# Patient Record
Sex: Female | Born: 1995 | Race: Black or African American | Hispanic: No | Marital: Single | Smoking: Never smoker
Health system: Southern US, Community
[De-identification: ages and names within clinical notes are randomized; demographics above are authoritative.]

## PROBLEM LIST (undated history)

## (undated) DIAGNOSIS — R55 Syncope and collapse: Secondary | ICD-10-CM

## (undated) DIAGNOSIS — J45909 Unspecified asthma, uncomplicated: Secondary | ICD-10-CM

---
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
## 2013-09-25 ENCOUNTER — Emergency Department (HOSPITAL_COMMUNITY)
Admission: EM | Admit: 2013-09-25 | Discharge: 2013-09-25 | Disposition: A | Discharge status: Home / Self Care | Payer: Medicaid Other | Source: Home / Self Care

## 2013-09-25 ENCOUNTER — Encounter (HOSPITAL_COMMUNITY): Payer: Self-pay | Admitting: Emergency Medicine

## 2013-09-25 DIAGNOSIS — K529 Noninfective gastroenteritis and colitis, unspecified: Secondary | ICD-10-CM

## 2013-09-25 HISTORY — DX: Unspecified asthma, uncomplicated: J45.909

## 2013-09-25 MED ORDER — ONDANSETRON 4 MG PO TBDP
ORAL_TABLET | ORAL | Status: AC
  Filled 2013-09-25: qty 2

## 2013-09-25 MED ORDER — ONDANSETRON HCL 4 MG PO TABS: 4.0000 mg | ORAL_TABLET | Freq: Four times a day (QID) | ORAL | Status: DC

## 2013-09-25 MED ORDER — ONDANSETRON 4 MG PO TBDP
8.0000 mg | ORAL_TABLET | Freq: Once | ORAL | Status: AC
  Administered 2013-09-25: 8 mg via ORAL

## 2013-09-25 NOTE — Discharge Instructions (Signed)
Clear liquid , bland diet tonight as tolerated, advance on wed as improved, use medicine as needed, imodium for diarrhea, return or see your doctor if any problems. °

## 2013-09-25 NOTE — ED Notes (Signed)
C/o RUQ abdominal pain onset yesterday. States the pain is making her vomit.  Vomited once yesterday and 5 times today.  Diarrhea x 5 today.  No fever.

## 2013-09-25 NOTE — ED Provider Notes (Signed)
CSN: [id number]     Arrival date & time 09/25/13  1923 History   None    Chief Complaint  Patient presents with  . Abdominal Pain   (Consider location/radiation/quality/duration/timing/severity/associated sxs/prior Treatment) Patient is a 18 y.o. female presenting with abdominal pain. The history is provided by the patient.  Abdominal Pain Pain location:  Epigastric Pain quality: burning   Pain severity:  Mild Onset quality:  Sudden Duration:  1 day Timing:  Constant Progression:  Unchanged Chronicity:  New Associated symptoms: chills, diarrhea, fever, nausea and vomiting   Associated symptoms: no melena, no vaginal bleeding and no vaginal discharge   Risk factors comment:  Sick contacts, works at [employer]   Past Medical History  Diagnosis Date  . Asthma    Past Surgical History  Procedure Laterality Date  . Wisdom tooth extraction  2013   Family History  Problem Relation Age of Onset  . Asthma Brother    History  Substance Use Topics  . Smoking status: Never Smoker   . Smokeless tobacco: Not on file  . Alcohol Use: No   OB History   Grav Para Term Preterm Abortions TAB SAB Ect Mult Living                 Review of Systems  Constitutional: Positive for fever and chills.  Gastrointestinal: Positive for nausea, vomiting, abdominal pain and diarrhea. Negative for melena.  Genitourinary: Negative for vaginal bleeding and vaginal discharge.    Allergies  Review of patient's allergies indicates no known allergies.  Home Medications   Current Outpatient Rx  Name  Route  Sig  Dispense  Refill  . albuterol (PROVENTIL HFA;VENTOLIN HFA) 108 (90 BASE) MCG/ACT inhaler   Inhalation   Inhale 2 puffs into the lungs every 6 (six) hours as needed for wheezing or shortness of breath.         . ondansetron (ZOFRAN) 4 MG tablet   Oral   Take 1 tablet (4 mg total) by mouth every 6 (six) hours. Prn n/v   8 tablet   0    BP 123/80  Pulse 79  Temp(Src) 98.3 F  (36.8 C) (Oral)  Resp 16  SpO2 99%  LMP 09/20/2013 Physical Exam  Nursing note and vitals reviewed. Constitutional: She is oriented to person, place, and time. She appears well-developed and well-nourished.  HENT:  Mouth/Throat: Oropharynx is clear and moist.  Neck: Normal range of motion. Neck supple.  Pulmonary/Chest: Breath sounds normal.  Abdominal: Soft. Normal appearance. She exhibits no distension and no mass. Bowel sounds are increased. There is no tenderness. There is no rigidity, no rebound, no guarding, no CVA tenderness, no tenderness at McBurney's point and negative Murphy's sign.  Lymphadenopathy:    She has no cervical adenopathy.  Neurological: She is alert and oriented to person, place, and time.  Skin: Skin is warm and dry.    ED Course  Procedures (including critical care time) Labs Review Labs Reviewed - No data to display Imaging Review No results found.  EKG Interpretation    Date/Time:    Ventricular Rate:    PR Interval:    QRS Duration:   QT Interval:    QTC Calculation:   R Axis:     Text Interpretation:              MDM     Linna HoffJames D Amandajo Gonder, MD 09/25/13 2045

## 2014-02-25 ENCOUNTER — Encounter (HOSPITAL_COMMUNITY): Payer: Self-pay | Admitting: Emergency Medicine

## 2014-02-25 ENCOUNTER — Emergency Department (HOSPITAL_COMMUNITY)
Admission: EM | Admit: 2014-02-25 | Discharge: 2014-02-25 | Disposition: A | Discharge status: Home / Self Care | Payer: Medicaid Other | Attending: Emergency Medicine | Admitting: Emergency Medicine

## 2014-02-25 DIAGNOSIS — R112 Nausea with vomiting, unspecified: Secondary | ICD-10-CM | POA: Insufficient documentation

## 2014-02-25 DIAGNOSIS — N390 Urinary tract infection, site not specified: Secondary | ICD-10-CM | POA: Insufficient documentation

## 2014-02-25 DIAGNOSIS — J45909 Unspecified asthma, uncomplicated: Secondary | ICD-10-CM | POA: Insufficient documentation

## 2014-02-25 DIAGNOSIS — Z79899 Other long term (current) drug therapy: Secondary | ICD-10-CM | POA: Insufficient documentation

## 2014-02-25 DIAGNOSIS — Z3202 Encounter for pregnancy test, result negative: Secondary | ICD-10-CM | POA: Insufficient documentation

## 2014-02-25 DIAGNOSIS — R42 Dizziness and giddiness: Secondary | ICD-10-CM | POA: Insufficient documentation

## 2014-02-25 HISTORY — DX: Syncope and collapse: R55

## 2014-02-25 LAB — POC URINE PREG, ED: Preg Test, Ur: NEGATIVE

## 2014-02-25 LAB — URINALYSIS, ROUTINE W REFLEX MICROSCOPIC
BILIRUBIN URINE: NEGATIVE
GLUCOSE, UA: NEGATIVE mg/dL
HGB URINE DIPSTICK: NEGATIVE
Ketones, ur: NEGATIVE mg/dL
Nitrite: POSITIVE — AB
PH: 6.5 (ref 5.0–8.0)
Protein, ur: NEGATIVE mg/dL
SPECIFIC GRAVITY, URINE: 1.026 (ref 1.005–1.030)
UROBILINOGEN UA: 1 mg/dL (ref 0.0–1.0)

## 2014-02-25 LAB — I-STAT CHEM 8, ED
BUN: 22 mg/dL (ref 6–23)
CALCIUM ION: 1.12 mmol/L (ref 1.12–1.23)
CHLORIDE: 103 meq/L (ref 96–112)
CREATININE: 0.8 mg/dL (ref 0.50–1.10)
GLUCOSE: 86 mg/dL (ref 70–99)
HEMATOCRIT: 46 % (ref 36.0–46.0)
Hemoglobin: 15.6 g/dL — ABNORMAL HIGH (ref 12.0–15.0)
POTASSIUM: 4 meq/L (ref 3.7–5.3)
Sodium: 140 mEq/L (ref 137–147)
TCO2: 24 mmol/L (ref 0–100)

## 2014-02-25 LAB — URINE MICROSCOPIC-ADD ON

## 2014-02-25 MED ORDER — SULFAMETHOXAZOLE-TRIMETHOPRIM 800-160 MG PO TABS: 1.0000 | ORAL_TABLET | Freq: Two times a day (BID) | ORAL | Status: DC

## 2014-02-25 MED ORDER — MECLIZINE HCL 25 MG PO TABS: 25.0000 mg | ORAL_TABLET | Freq: Three times a day (TID) | ORAL | Status: DC | PRN

## 2014-02-25 MED ORDER — ONDANSETRON HCL 4 MG PO TABS: 4.0000 mg | ORAL_TABLET | Freq: Four times a day (QID) | ORAL | Status: DC

## 2014-02-25 MED ORDER — CEFTRIAXONE SODIUM 1 G IJ SOLR
1.0000 g | Freq: Once | INTRAMUSCULAR | Status: AC
  Administered 2014-02-25: 1 g via INTRAMUSCULAR
  Filled 2014-02-25: qty 10

## 2014-02-25 MED ORDER — LIDOCAINE HCL 1 % IJ SOLN
INTRAMUSCULAR | Status: AC
  Administered 2014-02-25: 2.1 mL
  Filled 2014-02-25: qty 20

## 2014-02-25 NOTE — Discharge Instructions (Signed)
Call for a follow up appointment with a Family or Primary Care Provider.  Return if Symptoms worsen.   Take medication as prescribed.  Drink plenty of fluids, and eat a well balanced diet.  Emergency Department Resource Guide 1) Find a Doctor and Pay Out of Pocket Although you won't have to find out who is covered by your insurance plan, it is a good idea to ask around and get recommendations. You will then need to call the office and see if the doctor you have chosen will accept you as a new patient and what types of options they offer for patients who are self-pay. Some doctors offer discounts or will set up payment plans for their patients who do not have insurance, but you will need to ask so you aren't surprised when you get to your appointment.  2) Contact Your Local Health Department Not all health departments have doctors that can see patients for sick visits, but many do, so it is worth a call to see if yours does. If you don't know where your local health department is, you can check in your phone book. The CDC also has a tool to help you locate your state's health department, and many state websites also have listings of all of their local health departments.  3) Find a Walk-in Clinic If your illness is not likely to be very severe or complicated, you may want to try a walk in clinic. These are popping up all over the country in pharmacies, drugstores, and shopping centers. They're usually staffed by nurse practitioners or physician assistants that have been trained to treat common illnesses and complaints. They're usually fairly quick and inexpensive. However, if you have serious medical issues or chronic medical problems, these are probably not your best option.  No Primary Care Doctor: - Call Health Connect at  [phone] - they can help you locate a primary care doctor that  accepts your insurance, provides certain services, etc. - Physician Referral Service- [phone]  Chronic  Pain Problems: Organization         Address  Phone   Notes  [facility]  [phone]) [phone] Patients need to be referred by their primary care doctor.   Medication Assistance: Organization         Address  Phone   Notes  [facility] [address] [place], [place] [address] [phone] --Must be a resident of [place] -- Must have NO insurance coverage whatsoever (no Medicaid/ Medicare, etc.) -- The pt. MUST have a primary care doctor that directs their care regularly and follows them in the community   MedAssist  [phone]   [employer]  [phone]) [phone]    Agencies that provide inexpensive medical care: Organization         Address  Phone   Notes  [facility]  [phone]   [facility]    [phone]   [facility] [address] [place], [place] [address] [phone]   [facility] [address], [place] [phone]   Planned Parenthood    [phone]) [phone]   [facility]    [phone]   [facility]  [address], [place] Phone:  [phone], Fax:  [phone]) [phone] Hours of Operation:  9 am - 6 pm, M-F.  Also accepts Medicaid/Medicare and self-pay.  [facility]  [address], [place] Phone: [phone], Fax: [phone]. Hours of Operation:  8:30 am - 5:30 pm, M-F.  Also accepts Medicaid and self-pay.  [facility] [address], [place] Phone: [phone]   [place], [place], [place] [phone], Ext. [phone] Mondays & Thursdays: 7-9 AM.  First 15 patients are seen on a first come, first serve basis.    [place] Providers:  Organization         Address  Phone   Notes  [facility] [address], [place] [phone] Also  accepts self-pay patients.  [facility] [address], [place]  [phone]   [place], [address], [place] [phone]   [facility] [address], [place] [phone]   Lucianne Lei [address], [place]   [phone] Only accepts [place] Access [place] patients after they have their name applied to their card.   Self-Pay (no insurance) in [facility]:  Organization         Address  Phone   Notes  Sickle Cell Patients, [facility] [place] [phone]   [facility] [place] [phone]   [facility] [place]  [place], [place], [place] [phone]   [facility]/Dr. Osei-Bonsu  [address] [place] or [address], [place] [phone] Phone number for both [place] and [place] locations is the same.  [facility] [address], [place] [phone]   [facility] [address], [place] or [address] [phone]   [facility] [address] [place] [phone], phone; [phone], fax Sees patients 1st and 3rd Saturday of every month.  Must not qualify for public or private insurance (i.e. Medicaid, Medicare, [place] Health Choice, Veterans' Benefits)  Household income should be no more than 200% of the poverty level The clinic cannot treat you if you are pregnant or think you are pregnant  Sexually transmitted diseases are not treated at the clinic.    Dental Care: Organization         Address  Phone  Notes  [facility] Department of [facility] [phone] Accepts children up to age 78 who are enrolled in [place] or [place]; pregnant  women with a Medicaid card; and children who have applied for Medicaid or [place] Health Choice, but were declined, whose parents can pay a reduced fee at time of service.  [facility] Department of [facility]  [address], [place] [phone] Accepts children up to age 58 who are enrolled in [place] or [place]; pregnant women with a Medicaid card; and children who have applied for Medicaid or [place] Health Choice, but were declined, whose parents can pay a reduced fee at time of service.  [facility] Access PRO[facility]  [place] [phone] Patients are seen by appointment only. Walk-ins are not accepted. [place] will see patients 67 years of age and older. Monday - Tuesday (8am-5pm) Most Wednesdays (8:30-5pm) $30 per visit, cash only  [facility] [facility]  [address], [facility] [phone] Patients are seen by appointment only. Walk-ins are not accepted. [place] will see patients 29 years of age and older. One Wednesday Evening (Monthly: Volunteer Based).  $30 per visit, cash only  [place]  [phone] for adults; Children under age 108, call Graduate Pediatric Dentistry at [phone]. Children aged 52-14, please call [phone] to request a pediatric application.  Dental services are provided in all areas of dental care including fillings, crowns and bridges, complete and partial dentures, implants, gum treatment, root canals, and extractions. Preventive care is also provided. Treatment is provided to both adults and children. Patients are selected via a lottery and there is often a waiting list.   [facility] [address] [place]  [phone] [email or web address]   [facility] [address] [place], [place] [phone], Ext. [phone] Second and Fourth Thursday of each month, opens at 6:30 AM; Clinic ends at 9 AM.  Patients are  seen on a first-come first-served basis, and a limited number are seen during each clinic.   [facility]  [address] Hillard Danker [place], [place] [phone]   Eligibility Requirements You must have lived in [place], [place], or [place] counties for at least the last three months.   You cannot be eligible for state or federal sponsored [employer], including [employer], [place], or [employer].   You generally cannot be eligible for healthcare insurance through your employer.    How to apply: Eligibility screenings are held every Tuesday and Wednesday afternoon from 1:00 pm until 4:00 pm. You do not need an appointment for the interview!  [facility] [address], [place], [place]   [place] Department  [place]  [phone]    Behavioral Health Resources in the Community: Intensive Outpatient Programs Organization         Address  Phone  Notes  [place]. [address], [place], [place] [phone]   [facility] Outpatient [address], [place], [place]   ADS: Alcohol & Drug Svcs [address] [place], [place]   [place] [address]  [place], [place] or [phone]   Substance Abuse Resources Organization         Address  Phone  Notes  Alcohol and Drug Services  [phone]   [place]  [phone]   [facility] [place]   Chinita Pester  [phone]   Residential & Outpatient Substance Abuse Program  [phone]   Psychological Services Organization         Address  Phone  Notes  [facility] [place]  [place]  [phone]   [place] [address], [place] [phone] or [phone]    Mobile Crisis  Teams Organization         Address  Phone  Notes  [facility], Mobile Crisis Care Unit  [phone]   [facility]  [address] [place], [place]   [place] [address], [place] [phone]    Self-Help/Support Groups Organization         Address  Phone             Notes  [facility]ental [facility] Assoc[facility] of [place] - variety of support groups  [phone]- [phone] Call for more information  Narcotics Anonymous (NA), [facility]aring [facility] [address], [employer] [place]  2 meetings at this location   Statistician         Address  Phone  Notes  [facility] [address],    [place]  [phone]   [facility]  [address] [place] [address], [place], [place] [phone]   [facility] [address] [place], [place] [phone] Admissions: 8am-3pm M-F  [facility] [address]    [place], [place] [phone]   [facility] [address] [place], [place], [place] [phone]   [facility] [address]  [place], [place] [phone]   [facility] - Intensive Outpatient [address], [place], [place] [phone]   [facility] (Addiction Recovery Care [facility]oc[facility] [address] [place].,  [place], [place] [phone] or [phone]   [facility] Services (RTS) [address] [place], [place] [phone] Accepts Medicaid  [facility] [address],  [place] [phone] Substance Abuse/Addiction Treatment   [facility] Organization         Address  Phone  Notes  [facility]  [phone]   Angie Fava, PhD [address] Ervin Knack [place], [place]   [phone] or [phone]   [facility]al   [address] [place], [place] [phone]   [facility] [address] [place], [place] [phone]  Insurance/Medicaid/sponsorship through [facility] and Families [address]                                    [place], [place] [phone] Therapy/tele-psych/case  [facility] [address], [place] [phone]    Dr. Lolly Mustache  [phone]   [facility] [place]  United Way [facility] Dept. 1) [address], [place] 2) [address] [place] 3)  [address], [place] [phone]  [phone]   [facility] Child Abuse Hotline (567)013-7258 or [phone] (After Hours)

## 2014-02-25 NOTE — ED Provider Notes (Signed)
CSN: [id number]     Arrival date & time 02/25/14  1927 History   First MD Initiated Contact with Patient 02/25/14 2044     Chief Complaint  Patient presents with  . Near Syncope     (Consider location/radiation/quality/duration/timing/severity/associated sxs/prior Treatment) HPI Comments: Patient is a 18 year old female past medical history of asthma chief complaint of dizziness for 4 days. The patient reports multiple episodes of room spinning dizziness without loss of consciousness. The patient reports increased discomfort with body movement and walking. She reports 2 episodes of vomiting. Denies abdominal pain. Patient's last menstrual period was 02/11/2014. The patient reports she has had one episode in the past and was evaluated by a cardiologist, several years ago. The patient or the patient's mother does not know if there were any findings.  No PCP  The history is provided by the patient and a parent. No language interpreter was used.    Past Medical History  Diagnosis Date  . Asthma   . Near syncope    Past Surgical History  Procedure Laterality Date  . Wisdom tooth extraction  2013   Family History  Problem Relation Age of Onset  . Asthma Brother    History  Substance Use Topics  . Smoking status: Never Smoker   . Smokeless tobacco: Not on file  . Alcohol Use: No   OB History   Grav Para Term Preterm Abortions TAB SAB Ect Mult Living                 Review of Systems  Constitutional: Negative for fever and chills.  Eyes: Negative for photophobia and visual disturbance.  Cardiovascular: Negative for chest pain and palpitations.  Gastrointestinal: Positive for nausea and vomiting. Negative for abdominal pain.  Neurological: Positive for dizziness. Negative for seizures, syncope and weakness.      Allergies  Review of patient's allergies indicates no known allergies.  Home Medications   Prior to Admission medications   Medication Sig Start Date End Date  Taking? Authorizing Provider  albuterol (PROVENTIL HFA;VENTOLIN HFA) 108 (90 BASE) MCG/ACT inhaler Inhale 2 puffs into the lungs every 6 (six) hours as needed for wheezing or shortness of breath.    Historical Provider, MD  ondansetron (ZOFRAN) 4 MG tablet Take 1 tablet (4 mg total) by mouth every 6 (six) hours. Prn n/v 09/25/13   Linna HoffJames D Kindl, MD   BP 114/76  Pulse 86  Temp(Src) 98.6 F (37 C) (Oral)  Resp 20  Ht 1' (0.305 m)  Wt 118 lb (53.524 kg)  BMI 575.37 kg/m2  SpO2 98%  LMP 02/11/2014 Physical Exam  Nursing note and vitals reviewed. Constitutional: She is oriented to person, place, and time. She appears well-developed and well-nourished. No distress.  HENT:  Head: Normocephalic and atraumatic.  Right Ear: Tympanic membrane normal. Tympanic membrane is not bulging. No hemotympanum.  Left Ear: Tympanic membrane normal. Tympanic membrane is not bulging. No hemotympanum.  Mouth/Throat: Uvula is midline, oropharynx is clear and moist and mucous membranes are normal.  Eyes: EOM are normal. Pupils are equal, round, and reactive to light. No scleral icterus.  Neck: Neck supple.  Cardiovascular: Normal rate, regular rhythm and normal heart sounds.   No murmur heard. Pulmonary/Chest: Effort normal and breath sounds normal. She has no wheezes.  Abdominal: Soft. Bowel sounds are normal. There is no tenderness. There is no rebound and no guarding.  Musculoskeletal: Normal range of motion. She exhibits no edema.  Neurological: She is alert and oriented to  person, place, and time. No cranial nerve deficit or sensory deficit. GCS eye subscore is 4. GCS verbal subscore is 5. GCS motor subscore is 6.  Speech is clear and goal oriented, follows commands Cranial nerves III - XII grossly intact, no facial droop Normal strength in upper and lower extremities bilaterally, strong and equal grip strength Sensation normal to light and sharp touch Moves all 4 extremities without ataxia, coordination  intact Normal finger to nose and rapid alternating movements No pronator drift  Skin: Skin is warm and dry. No rash noted.  Psychiatric: She has a normal mood and affect. Her behavior is normal.    ED Course  Procedures (including critical care time) Labs Review Results for orders placed during the hospital encounter of 02/25/14  URINE CULTURE      Result Value Ref Range   Specimen Description URINE, RANDOM     Special Requests NONE     Culture  Setup Time       Value: 02/26/2014 04:05     Performed at [facility] Count       Value: >=100,000 COLONIES/ML     Performed at [facility]   Culture       Value: ESCHERICHIA COLI     Performed at [facility]   Report Status 02/27/2014 FINAL     Organism ID, Bacteria ESCHERICHIA COLI    URINALYSIS, ROUTINE W REFLEX MICROSCOPIC      Result Value Ref Range   Color, Urine YELLOW  YELLOW   APPearance CLOUDY (*) CLEAR   Specific Gravity, Urine 1.026  1.005 - 1.030   pH 6.5  5.0 - 8.0   Glucose, UA NEGATIVE  NEGATIVE mg/dL   Hgb urine dipstick NEGATIVE  NEGATIVE   Bilirubin Urine NEGATIVE  NEGATIVE   Ketones, ur NEGATIVE  NEGATIVE mg/dL   Protein, ur NEGATIVE  NEGATIVE mg/dL   Urobilinogen, UA 1.0  0.0 - 1.0 mg/dL   Nitrite POSITIVE (*) NEGATIVE   Leukocytes, UA SMALL (*) NEGATIVE  URINE MICROSCOPIC-ADD ON      Result Value Ref Range   Squamous Epithelial / LPF RARE  RARE   WBC, UA 7-10  <3 WBC/hpf   RBC / HPF 0-2  <3 RBC/hpf   Bacteria, UA MANY (*) RARE   Urine-Other MANY YEAST    CBG MONITORING, ED      Result Value Ref Range   Glucose-Capillary 98  70 - 99 mg/dL  I-STAT CHEM 8, ED      Result Value Ref Range   Sodium 140  137 - 147 mEq/L   Potassium 4.0  3.7 - 5.3 mEq/L   Chloride 103  96 - 112 mEq/L   BUN 22  6 - 23 mg/dL   Creatinine, Ser 1.61  0.50 - 1.10 mg/dL   Glucose, Bld 86  70 - 99 mg/dL   Calcium, Ion 0.96  0.45 - 1.23 mmol/L   TCO2 24  0 - 100 mmol/L   Hemoglobin 15.6 (*)  12.0 - 15.0 g/dL   HCT 40.9  81.1 - 91.4 %  POC URINE PREG, ED      Result Value Ref Range   Preg Test, Ur NEGATIVE  NEGATIVE   No results found.   Imaging Review No results found.   EKG Interpretation   Date/Time:  Monday February 25 2014 19:41:58 EDT Ventricular Rate:  85 PR Interval:  141 QRS Duration: 76 QT Interval:  364 QTC Calculation: 433 R  Axis:   43 Text Interpretation:  Sinus rhythm Normal ECG No significant change since  last tracing Confirmed by GOLDSTON  MD, SCOTT ([id number]) on 02/25/2014 7:46:59  PM      MDM   Final diagnoses:  Dizziness  UTI (lower urinary tract infection)    Patient presents with a four-day history of vertigo-like symptoms. No neurologic deficits on exam. EKG without concerning abnormalities. Awaiting blood work and urine pregnancy. Urine shows infection, negative pregnancy.  Normal EKG. Pt with likely vertigo and UTI. Discussed lab results, and treatment plan with the patient and the patient's mother. Return precautions given. Reports understanding and no other concerns at this time.  Patient is stable for discharge at this time.  Meds given in ED:  Medications - No data to display  Discharge Medication List as of 02/25/2014 11:18 PM    START taking these medications   Details  meclizine (ANTIVERT) 25 MG tablet Take 1 tablet (25 mg total) by mouth 3 (three) times daily as needed for dizziness., Starting 02/25/2014, Until Discontinued, Print    ondansetron (ZOFRAN) 4 MG tablet Take 1 tablet (4 mg total) by mouth every 6 (six) hours., Starting 02/25/2014, Until Discontinued, Print    sulfamethoxazole-trimethoprim (SEPTRA DS) 800-160 MG per tablet Take 1 tablet by mouth 2 (two) times daily., Starting 02/25/2014, Until Discontinued, Print            Clabe Seal, PA-C 02/27/14 [phone]

## 2014-02-25 NOTE — ED Notes (Signed)
Patient is alert and oriented x3.  She states that she has not been feeling right since Friday. She states that she has almost passed out on Saturday, Sunday and today.  Patient denies  Any strenuous activity when this issue happened.  Patient mother states that the patient has  A history of this issue.

## 2014-02-26 LAB — CBG MONITORING, ED: Glucose-Capillary: 98 mg/dL (ref 70–99)

## 2014-02-27 LAB — URINE CULTURE: Colony Count: >=100000

## 2014-02-27 NOTE — ED Provider Notes (Signed)
Medical screening examination/treatment/procedure(s) were performed by non-physician practitioner and as supervising physician I was immediately available for consultation/collaboration.   EKG Interpretation   Date/Time:  Monday February 25 2014 19:41:58 EDT Ventricular Rate:  85 PR Interval:  141 QRS Duration: 76 QT Interval:  364 QTC Calculation: 433 R Axis:   43 Text Interpretation:  Sinus rhythm Normal ECG No significant change since  last tracing Confirmed by Jenson Beedle  MD, Andron Marrazzo ([id number]) on 02/25/2014 7:46:59  PM        Audree Camel, MD 02/27/14 1627

## 2014-03-03 ENCOUNTER — Telehealth (HOSPITAL_BASED_OUTPATIENT_CLINIC_OR_DEPARTMENT_OTHER): Payer: Self-pay | Admitting: Emergency Medicine

## 2014-03-03 NOTE — Telephone Encounter (Signed)
Per pharmacist, patient treated with Sulfa-Trimeth. Sensitive to same.

## 2014-10-23 ENCOUNTER — Ambulatory Visit (INDEPENDENT_AMBULATORY_CARE_PROVIDER_SITE_OTHER): Payer: Self-pay | Admitting: Family Medicine

## 2014-10-23 VITALS — BP 118/68 | HR 86 | Temp 98.7°F | Resp 16 | Ht 62.0 in | Wt 119.4 lb

## 2014-10-23 DIAGNOSIS — R059 Cough, unspecified: Secondary | ICD-10-CM

## 2014-10-23 DIAGNOSIS — R1030 Lower abdominal pain, unspecified: Secondary | ICD-10-CM

## 2014-10-23 DIAGNOSIS — J45901 Unspecified asthma with (acute) exacerbation: Secondary | ICD-10-CM

## 2014-10-23 DIAGNOSIS — R197 Diarrhea, unspecified: Secondary | ICD-10-CM

## 2014-10-23 DIAGNOSIS — R112 Nausea with vomiting, unspecified: Secondary | ICD-10-CM

## 2014-10-23 DIAGNOSIS — Z79899 Other long term (current) drug therapy: Secondary | ICD-10-CM

## 2014-10-23 DIAGNOSIS — R05 Cough: Secondary | ICD-10-CM

## 2014-10-23 LAB — POCT URINALYSIS DIPSTICK
BILIRUBIN UA: NEGATIVE
GLUCOSE UA: NEGATIVE
KETONES UA: NEGATIVE
Leukocytes, UA: NEGATIVE
NITRITE UA: NEGATIVE
PH UA: 7.5
Protein, UA: 30
SPEC GRAV UA: 1.02
UROBILINOGEN UA: 0.2

## 2014-10-23 LAB — POCT CBC
GRANULOCYTE PERCENT: 63.9 % (ref 37–80)
HEMATOCRIT: 42.8 % (ref 37.7–47.9)
Hemoglobin: 13.5 g/dL (ref 12.2–16.2)
LYMPH, POC: 2.6 (ref 0.6–3.4)
MCH, POC: 31 pg (ref 27–31.2)
MCHC: 31.6 g/dL — AB (ref 31.8–35.4)
MCV: 98.3 fL — AB (ref 80–97)
MID (cbc): 0.2 (ref 0–0.9)
MPV: 7.6 fL (ref 0–99.8)
PLATELET COUNT, POC: 311 10*3/uL (ref 142–424)
POC GRANULOCYTE: 5 (ref 2–6.9)
POC LYMPH %: 33.5 % (ref 10–50)
POC MID %: 2.6 % (ref 0–12)
RBC: 4.35 M/uL (ref 4.04–5.48)
RDW, POC: 15.2 %
WBC: 7.9 10*3/uL (ref 4.6–10.2)

## 2014-10-23 LAB — GLUCOSE, POCT (MANUAL RESULT ENTRY): POC Glucose: 85 mg/dl (ref 70–99)

## 2014-10-23 LAB — POCT UA - MICROSCOPIC ONLY
BACTERIA, U MICROSCOPIC: NEGATIVE
CASTS, UR, LPF, POC: NEGATIVE
CRYSTALS, UR, HPF, POC: NEGATIVE
EPITHELIAL CELLS, URINE PER MICROSCOPY: NEGATIVE
MUCUS UA: NEGATIVE
WBC, UR, HPF, POC: NEGATIVE
YEAST UA: NEGATIVE

## 2014-10-23 LAB — POCT URINE PREGNANCY: Preg Test, Ur: NEGATIVE

## 2014-10-23 MED ORDER — ALBUTEROL SULFATE HFA 108 (90 BASE) MCG/ACT IN AERS: 1.0000 | INHALATION_SPRAY | RESPIRATORY_TRACT | Status: AC | PRN

## 2014-10-23 MED ORDER — ALBUTEROL SULFATE (2.5 MG/3ML) 0.083% IN NEBU
2.5000 mg | INHALATION_SOLUTION | Freq: Once | RESPIRATORY_TRACT | Status: AC
  Administered 2014-10-23: 2.5 mg via RESPIRATORY_TRACT

## 2014-10-23 MED ORDER — BECLOMETHASONE DIPROPIONATE 80 MCG/ACT IN AERS: 1.0000 | INHALATION_SPRAY | Freq: Two times a day (BID) | RESPIRATORY_TRACT | Status: DC

## 2014-10-23 MED ORDER — PREDNISONE 20 MG PO TABS: 40.0000 mg | ORAL_TABLET | Freq: Every day | ORAL | Status: DC

## 2014-10-23 MED ORDER — ALBUTEROL SULFATE HFA 108 (90 BASE) MCG/ACT IN AERS: 1.0000 | INHALATION_SPRAY | RESPIRATORY_TRACT | Status: DC | PRN

## 2014-10-23 MED ORDER — BECLOMETHASONE DIPROPIONATE 80 MCG/ACT IN AERS
1.0000 | INHALATION_SPRAY | Freq: Two times a day (BID) | RESPIRATORY_TRACT | Status: AC
Start: 2014-10-23 — End: ?

## 2014-10-23 NOTE — Progress Notes (Addendum)
Subjective:  This chart was scribed for Sonya Staggers, MD by Haywood Pao, ED Scribe at [facility].The patient was seen in exam room 10 and the patient's care was started at 8:46 PM.   Patient ID: Sonya Scott, female    DOB: [birth year], 19 y.o.   MRN: [id number] Chief Complaint  Patient presents with  . Emesis    x 2 weeks   . Cough    productive   . Headache  . Diarrhea   HPI HPI Comments: Sonya Scott is a 19 y.o. female with a history of asthma who presents to [facility] complaining of emesis and diarrhea onset 2 weeks ago. Pt's diarrhea is persistent with 3-4 occurences a day and primarily in the morning. The vomiting has improved initially 3-4 times a day, now twice a day. She has intermittent hot/cold spells, HA, cough, suprapubic abdominal pain, and increased urinary frequency as associated symptoms. Her cough is producing a yellow mucous and typically leads to the vomiting. Her last episode of hot/cold spells was Sunday. She has taken nyquil, tylenol and robitussin for relief. Pt has a history of asthma and has an albuterol inhaler. Recently she has used her inhaler 10 times a day because of her symptoms, prior to this incident she typically uses the inhaler 3 times a day. Pt has had a nebulizer treatment. She is not prescribed any daily medicine for asthma. Pt is currently on her menstrual period, her LNMP started on the 6th of last month. Pt [facility] but left because she was waiting too long. She has no PCP. No recent antibiotics and no recent hospitalization. No sick contacts, no flu shot. 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   There are no active problems to display for this patient.  Past Medical History  Diagnosis Date  . Asthma   . Near syncope    Past Surgical History  Procedure Laterality Date  . Wisdom tooth extraction  2013   No Known Allergies Prior to  Admission medications   Medication Sig Start Date End Date Taking? Authorizing Provider  acetaminophen (TYLENOL) 500 MG tablet Take 1,000 mg by mouth every 6 (six) hours as needed (cramps).   Yes Historical Provider, MD  albuterol (PROVENTIL HFA;VENTOLIN HFA) 108 (90 BASE) MCG/ACT inhaler Inhale 2 puffs into the lungs every 6 (six) hours as needed for wheezing or shortness of breath.   Yes Historical Provider, MD  meclizine (ANTIVERT) 25 MG tablet Take 1 tablet (25 mg total) by mouth 3 (three) times daily as needed for dizziness. Patient not taking: Reported on 10/23/2014 02/25/14   Mellody Drown, PA-C  ondansetron (ZOFRAN) 4 MG tablet Take 1 tablet (4 mg total) by mouth every 6 (six) hours. Patient not taking: Reported on 10/23/2014 02/25/14   Mellody Drown, PA-C  sulfamethoxazole-trimethoprim (SEPTRA DS) 800-160 MG per tablet Take 1 tablet by mouth 2 (two) times daily. Patient not taking: Reported on 10/23/2014 02/25/14   Mellody Drown, PA-C   History   Social History  . Marital Status: Single    Spouse Name: N/A    Number of Children: N/A  . Years of Education: N/A   Occupational History  . Not on file.   Social History Main Topics  . Smoking status: Never Smoker   . Smokeless tobacco: Not on file  . Alcohol Use: No  . Drug Use: No  . Sexual Activity:  Yes    Birth Control/ Protection: None   Other Topics Concern  . Not on file   Social History Narrative   Review of Systems  Constitutional: Positive for chills and diaphoresis.  Respiratory: Positive for cough.   Gastrointestinal: Positive for nausea, vomiting, abdominal pain and diarrhea.  Genitourinary: Positive for frequency.  Neurological: Positive for headaches.       Objective:  BP 118/68 mmHg  Pulse 86  Temp(Src) 98.7 F (37.1 C) (Oral)  Resp 16  Ht 5\' 2"  (1.575 m)  Wt 119 lb 6.4 oz (54.159 kg)  BMI 21.83 kg/m2  SpO2 99%  LMP 10/23/2014  Physical Exam  Constitutional: She is oriented to person, place, and time. She  appears well-developed and well-nourished. No distress.  HENT:  Head: Normocephalic and atraumatic.  Right Ear: Hearing, tympanic membrane, external ear and ear canal normal.  Left Ear: Hearing, tympanic membrane, external ear and ear canal normal.  Nose: Nose normal.  Mouth/Throat: Oropharynx is clear and moist. No oropharyngeal exudate.  Eyes: Conjunctivae and EOM are normal. Pupils are equal, round, and reactive to light.  Cardiovascular: Normal rate, regular rhythm, normal heart sounds and intact distal pulses.   No murmur heard. Pulmonary/Chest: Effort normal and breath sounds normal. No respiratory distress. She has no wheezes. She has no rhonchi.  Diffuse wheezing in her lungs.  Abdominal: There is no rebound and no guarding.  No CVA tenderness Minimal suprapubic tenderness.  Neurological: She is alert and oriented to person, place, and time.  Skin: Skin is warm and dry. No rash noted.  Psychiatric: She has a normal mood and affect. Her behavior is normal.  Vitals reviewed.   Exam after albuterol 2.5mg  neb - improved aeration, faint wheeze. Peak flow 250, but only fair effort, and speaking in full sentences without distress.   Results for orders placed or performed in visit on 10/23/14  POCT CBC  Result Value Ref Range   WBC 7.9 4.6 - 10.2 K/uL   Lymph, poc 2.6 0.6 - 3.4   POC LYMPH PERCENT 33.5 10 - 50 %L   MID (cbc) 0.2 0 - 0.9   POC MID % 2.6 0 - 12 %M   POC Granulocyte 5.0 2 - 6.9   Granulocyte percent 63.9 37 - 80 %G   RBC 4.35 4.04 - 5.48 M/uL   Hemoglobin 13.5 12.2 - 16.2 g/dL   HCT, POC 16.1 09.6 - 47.9 %   MCV 98.3 (A) 80 - 97 fL   MCH, POC 31.0 27 - 31.2 pg   MCHC 31.6 (A) 31.8 - 35.4 g/dL   RDW, POC 04.5 %   Platelet Count, POC 311 142 - 424 K/uL   MPV 7.6 0 - 99.8 fL  POCT urine pregnancy  Result Value Ref Range   Preg Test, Ur Negative   POCT UA - Microscopic Only  Result Value Ref Range   WBC, Ur, HPF, POC Neg    RBC, urine, microscopic TNTC     Bacteria, U Microscopic neg    Mucus, UA neg    Epithelial cells, urine per micros neg    Crystals, Ur, HPF, POC neg    Casts, Ur, LPF, POC neg    Yeast, UA neg   POCT urinalysis dipstick  Result Value Ref Range   Color, UA red    Clarity, UA cloudy    Glucose, UA neg    Bilirubin, UA neg    Ketones, UA neg    Spec Grav, UA  1.020    Blood, UA large    pH, UA 7.5    Protein, UA 30    Urobilinogen, UA 0.2    Nitrite, UA neg    Leukocytes, UA Negative   (currently on menses)     Assessment & Plan:   ANASTYN AYARS is a 19 y.o. female Non-intractable vomiting with nausea, vomiting of unspecified type, Diarrhea, abdominal pain, suprapubic, unspecified laterality - Plan: POCT CBC, POCT UA - Microscopic Only, POCT urinalysis dipstick  - suspected viral illness, including possible influenza. Afebrile, reassuring CBC. NO known risk factors for C diff, and no recent travel. Less likely infectious diarrhea, but discussed further stool testing if not improving.   Cough,  Asthma with exacerbation, unspecified asthma severity - Plan: albuterol (PROVENTIL) (2.5 MG/3ML) 0.083% nebulizer solution 2.5 mg, POCT glucose (manual entry), albuterol (PROVENTIL HFA;VENTOLIN HFA) 108 (90 BASE) MCG/ACT inhaler, beclomethasone (QVAR) 80 MCG/ACT inhaler, predniSONE (DELTASONE) 20 MG tablet, (meds reordered to open pharmacy)  -underlying asthma that was not controlled prior to acute illness, and now in flare with virus. Frequent albuterol use at home prior. Symptomatic improvement and good air mvmt after neb - no respiratory distress. Offered 2nd neb, but declined.  -Appeared stable for home albuterol by [facility] and start prednisone (side effects discussed), but if increased albuterol need overnight or worsening in breathing - be seen at ER right away.   -after prednisone, start Qvar for daily use, albuterol if needed and check status of asthma in next month.   -ER/RTC precautions given.   High risk medication use  - Plan: POCT glucose (manual entry) - ok for prednisone.    Meds ordered this encounter  Medications  . albuterol (PROVENTIL) (2.5 MG/3ML) 0.083% nebulizer solution 2.5 mg    Sig:   . DISCONTD: beclomethasone (QVAR) 80 MCG/ACT inhaler    Sig: Inhale 1 puff into the lungs 2 (two) times daily.    Dispense:  1 Inhaler    Refill:  1  . DISCONTD: predniSONE (DELTASONE) 20 MG tablet    Sig: Take 2 tablets (40 mg total) by mouth daily with breakfast.    Dispense:  10 tablet    Refill:  0  . DISCONTD: albuterol (PROVENTIL HFA;VENTOLIN HFA) 108 (90 BASE) MCG/ACT inhaler    Sig: Inhale 1-2 puffs into the lungs every 4 (four) hours as needed for wheezing or shortness of breath.    Dispense:  1 Inhaler    Refill:  0  . albuterol (PROVENTIL HFA;VENTOLIN HFA) 108 (90 BASE) MCG/ACT inhaler    Sig: Inhale 1-2 puffs into the lungs every 4 (four) hours as needed for wheezing or shortness of breath.    Dispense:  1 Inhaler    Refill:  0  . beclomethasone (QVAR) 80 MCG/ACT inhaler    Sig: Inhale 1 puff into the lungs 2 (two) times daily.    Dispense:  1 Inhaler    Refill:  1  . predniSONE (DELTASONE) 20 MG tablet    Sig: Take 2 tablets (40 mg total) by mouth daily with breakfast.    Dispense:  10 tablet    Refill:  0   Patient Instructions  With cough, headache, vomiting and diarrhea, I suspect you have a viral illness, such as the flu or other virus.  Your asthma also appears to be flared, and the coughing from this may be causing vomiting (post tussive emesis). See information below on treatment of vomiting and diarrhea, but if the diarrhea is not improving  in then next 4-5 days, would recommend stool tests at that time to look at other infectious causes of diarrhea. Return to the clinic or go to the nearest emergency room if any of your symptoms worsen or new symptoms occur. Including any measured fever, increased abdominal pain or other worsening.   For your current asthma flare - albuterol every  4 hours as needed, but can start prednisone to calm down wheezing. After 5 days - can start inhaled steroid (Qvar) since you used albuterol frequently before and asthma was uncontrolled prior to current illness. Qvar every day as maintenance medicine and albuterol if needed for wheezing as rescue inhaler. Follow up with provider here or other medical provider in next 1 month to determine asthma control. If your cough and wheezing are not improving in the next 2 days - return for recheck. If you require albuterol sooner than 4 hours - go to the emergency room. Return to the clinic or go to the nearest emergency room if any of your symptoms worsen or new symptoms occur.  Asthma, Acute Bronchospasm Acute bronchospasm caused by asthma is also referred to as an asthma attack. Bronchospasm means your air passages become narrowed. The narrowing is caused by inflammation and tightening of the muscles in the air tubes (bronchi) in your lungs. This can make it hard to breathe or cause you to wheeze and cough. CAUSES Possible triggers are:  Animal dander from the skin, hair, or feathers of animals.  Dust mites contained in house dust.  Cockroaches.  Pollen from trees or grass.  Mold.  Cigarette or tobacco smoke.  Air pollutants such as dust, household cleaners, hair sprays, aerosol sprays, paint fumes, strong chemicals, or strong odors.  Cold air or weather changes. Cold air may trigger inflammation. Winds increase molds and pollens in the air.  Strong emotions such as crying or laughing hard.  Stress.  Certain medicines such as aspirin or beta-blockers.  Sulfites in foods and drinks, such as dried fruits and wine.  Infections or inflammatory conditions, such as a flu, cold, or inflammation of the nasal membranes (rhinitis).  Gastroesophageal reflux disease (GERD). GERD is a condition where stomach acid backs up into your esophagus.  Exercise or strenuous activity. SIGNS AND SYMPTOMS    Wheezing.  Excessive coughing, particularly at night.  Chest tightness.  Shortness of breath. DIAGNOSIS  Your health care provider will ask you about your medical history and perform a physical exam. A chest X-ray or blood testing may be performed to look for other causes of your symptoms or other conditions that may have triggered your asthma attack. TREATMENT  Treatment is aimed at reducing inflammation and opening up the airways in your lungs. Most asthma attacks are treated with inhaled medicines. These include quick relief or rescue medicines (such as bronchodilators) and controller medicines (such as inhaled corticosteroids). These medicines are sometimes given through an inhaler or a nebulizer. Systemic steroid medicine taken by mouth or given through an IV tube also can be used to reduce the inflammation when an attack is moderate or severe. Antibiotic medicines are only used if a bacterial infection is present.  HOME CARE INSTRUCTIONS   Rest.  Drink plenty of liquids. This helps the mucus to remain thin and be easily coughed up. Only use caffeine in moderation and do not use alcohol until you have recovered from your illness.  Do not smoke. Avoid being exposed to secondhand smoke.  You play a critical role in keeping yourself in good  health. Avoid exposure to things that cause you to wheeze or to have breathing problems.  Keep your medicines up-to-date and available. Carefully follow your health care provider's treatment plan.  Take your medicine exactly as prescribed.  When pollen or pollution is bad, keep windows closed and use an air conditioner or go to places with air conditioning.  Asthma requires careful medical care. See your health care provider for a follow-up as advised. If you are more than [redacted] weeks pregnant and you were prescribed any new medicines, let your obstetrician know about the visit and how you are doing. Follow up with your health care provider as  directed.  After you have recovered from your asthma attack, make an appointment with your outpatient doctor to talk about ways to reduce the likelihood of future attacks. If you do not have a doctor who manages your asthma, make an appointment with a primary care doctor to discuss your asthma. SEEK IMMEDIATE MEDICAL CARE IF:   You are getting worse.  You have trouble breathing. If severe, call your local emergency services (911 in the [place]  You develop chest pain or discomfort.  You are vomiting.  You are not able to keep fluids down.  You are coughing up yellow, green, brown, or bloody sputum.  You have a fever and your symptoms suddenly get worse.  You have trouble swallowing. MAKE SURE YOU:   Understand these instructions.  Will watch your condition.  Will get help right away if you are not doing well or get worse. Document Released: 12/22/2006 Document Revised: 09/11/2013 Document Reviewed: 03/14/2013 [facility] Patient Information 2015 [place], [place]. This information is not intended to replace advice given to you by your health care provider. Make sure you discuss any questions you have with your health care provider.  Gastroenteritis:  Diarrhea Infections caused by germs (bacterial) or a virus commonly cause diarrhea. Your caregiver has determined that with time, rest and fluids, the diarrhea should improve. In general, eat normally while drinking more water than usual. Although water may prevent dehydration, it does not contain salt and minerals (electrolytes). Broths, weak tea without caffeine and oral rehydration solutions (ORS) replace fluids and electrolytes. Small amounts of fluids should be taken frequently. Large amounts at one time may not be tolerated. Plain water may be harmful in infants and the elderly. Oral rehydrating solutions (ORS) are available at pharmacies and grocery stores. ORS replace water and important electrolytes in proper proportions. Sports  drinks are not as effective as ORS and may be harmful due to sugars worsening diarrhea.  ORS is especially recommended for use in children with diarrhea. As a general guideline for children, replace any new fluid losses from diarrhea and/or vomiting with ORS as follows:   If your child weighs 22 pounds or under (10 kg or less), give 60-120 mL ( -  cup or 2 - 4 ounces) of ORS for each episode of diarrheal stool or vomiting episode.   If your child weighs more than 22 pounds (more than 10 kgs), give 120-240 mL ( - 1 cup or 4 - 8 ounces) of ORS for each diarrheal stool or episode of vomiting.   While correcting for dehydration, children should eat normally. However, foods high in sugar should be avoided because this may worsen diarrhea. Large amounts of carbonated soft drinks, juice, gelatin desserts and other highly sugared drinks should be avoided.   After correction of dehydration, other liquids that are appealing to the child may be added. Children  should drink small amounts of fluids frequently and fluids should be increased as tolerated. Children should drink enough fluids to keep urine clear or pale yellow.   Adults should eat normally while drinking more fluids than usual. Drink small amounts of fluids frequently and increase as tolerated. Drink enough fluids to keep urine clear or pale yellow. Broths, weak decaffeinated tea, lemon lime soft drinks (allowed to go flat) and ORS replace fluids and electrolytes.   Avoid:   Carbonated drinks.   Juice.   Extremely hot or cold fluids.   Caffeine drinks.   Fatty, greasy foods.   Alcohol.   Tobacco.   Too much intake of anything at one time.   Gelatin desserts.   Probiotics are active cultures of beneficial bacteria. They may lessen the amount and number of diarrheal stools in adults. Probiotics can be found in yogurt with active cultures and in supplements.   Wash hands well to avoid spreading bacteria and virus.    Anti-diarrheal medications are not recommended for infants and children.   Only take over-the-counter or prescription medicines for pain, discomfort or fever as directed by your caregiver. Do not give aspirin to children because it may cause Reye's Syndrome.   For adults, ask your caregiver if you should continue all prescribed and over-the-counter medicines.   If your caregiver has given you a follow-up appointment, it is very important to keep that appointment. Not keeping the appointment could result in a chronic or permanent injury, and disability. If there is any problem keeping the appointment, you must call back to this facility for assistance.  SEEK IMMEDIATE MEDICAL CARE IF:   You or your child is unable to keep fluids down or other symptoms or problems become worse in spite of treatment.   Vomiting or diarrhea develops and becomes persistent.   There is vomiting of blood or bile (green material).   There is blood in the stool or the stools are black and tarry.   There is no urine output in 6-8 hours or there is only a small amount of very dark urine.   Abdominal pain develops, increases or localizes.   You have a fever.   Your baby is older than 3 months with a rectal temperature of 102 F (38.9 C) or higher.   Your baby is 69 months old or younger with a rectal temperature of 100.4 F (38 C) or higher.   You or your child develops excessive weakness, dizziness, fainting or extreme thirst.   You or your child develops a rash, stiff neck, severe headache or become irritable or sleepy and difficult to awaken.  MAKE SURE YOU:   Understand these instructions.   Will watch your condition.   Will get help right away if you are not doing well or get worse.  Document Released: 08/27/2002 Document Revised: 08/26/2011 Document Reviewed: 07/14/2009 [facility] Patient Information 2012 [place], [place].  Nausea and Vomiting Nausea is a sick feeling that often comes before  throwing up (vomiting). Vomiting is a reflex where stomach contents come out of your mouth. Vomiting can cause severe loss of body fluids (dehydration). Children and elderly adults can become dehydrated quickly, especially if they also have diarrhea. Nausea and vomiting are symptoms of a condition or disease. It is important to find the cause of your symptoms. CAUSES   Direct irritation of the stomach lining. This irritation can result from increased acid production (gastroesophageal reflux disease), infection, food poisoning, taking certain medicines (such as nonsteroidal anti-inflammatory drugs),  alcohol use, or tobacco use.   Signals from the brain.These signals could be caused by a headache, heat exposure, an inner ear disturbance, increased pressure in the brain from injury, infection, a tumor, or a concussion, pain, emotional stimulus, or metabolic problems.   An obstruction in the gastrointestinal tract (bowel obstruction).   Illnesses such as diabetes, hepatitis, gallbladder problems, appendicitis, kidney problems, cancer, sepsis, atypical symptoms of a heart attack, or eating disorders.   Medical treatments such as chemotherapy and radiation.   Receiving medicine that makes you sleep (general anesthetic) during surgery.  DIAGNOSIS Your caregiver may ask for tests to be done if the problems do not improve after a few days. Tests may also be done if symptoms are severe or if the reason for the nausea and vomiting is not clear. Tests may include:  Urine tests.   Blood tests.   Stool tests.   Cultures (to look for evidence of infection).   X-rays or other imaging studies.  Test results can help your caregiver make decisions about treatment or the need for additional tests. TREATMENT You need to stay well hydrated. Drink frequently but in small amounts.You may wish to drink water, sports drinks, clear broth, or eat frozen ice pops or gelatin dessert to help stay hydrated.When you  eat, eating slowly may help prevent nausea.There are also some antinausea medicines that may help prevent nausea. HOME CARE INSTRUCTIONS   Take all medicine as directed by your caregiver.   If you do not have an appetite, do not force yourself to eat. However, you must continue to drink fluids.   If you have an appetite, eat a normal diet unless your caregiver tells you differently.   Eat a variety of complex carbohydrates (rice, wheat, potatoes, bread), lean meats, yogurt, fruits, and vegetables.   Avoid high-fat foods because they are more difficult to digest.   Drink enough water and fluids to keep your urine clear or pale yellow.   If you are dehydrated, ask your caregiver for specific rehydration instructions. Signs of dehydration may include:   Severe thirst.   Dry lips and mouth.   Dizziness.   Dark urine.   Decreasing urine frequency and amount.   Confusion.   Rapid breathing or pulse.  SEEK IMMEDIATE MEDICAL CARE IF:   You have blood or brown flecks (like coffee grounds) in your vomit.   You have black or bloody stools.   You have a severe headache or stiff neck.   You are confused.   You have severe abdominal pain.   You have chest pain or trouble breathing.   You do not urinate at least once every 8 hours.   You develop cold or clammy skin.   You continue to vomit for longer than 24 to 48 hours.   You have a fever.  MAKE SURE YOU:   Understand these instructions.   Will watch your condition.   Will get help right away if you are not doing well or get worse.  Document Released: 09/06/2005 Document Revised: 08/26/2011 Document Reviewed: 02/03/2011 [facility] Patient Information 2012 [place], [place].  Return to the clinic or go to the nearest emergency room if any of your symptoms worsen or new symptoms occur.    I personally performed the services described in this documentation, which was scribed in my presence. The recorded information has  been reviewed and considered, and addended by me as needed.

## 2014-10-23 NOTE — Patient Instructions (Addendum)
With cough, headache, vomiting and diarrhea, I suspect you have a viral illness, such as the flu or other virus.  Your asthma also appears to be flared, and the coughing from this may be causing vomiting (post tussive emesis). See information below on treatment of vomiting and diarrhea, but if the diarrhea is not improving in then next 4-5 days, would recommend stool tests at that time to look at other infectious causes of diarrhea. Return to the clinic or go to the nearest emergency room if any of your symptoms worsen or new symptoms occur. Including any measured fever, increased abdominal pain or other worsening.   For your current asthma flare - albuterol every 4 hours as needed, but can start prednisone to calm down wheezing. After 5 days - can start inhaled steroid (Qvar) since you used albuterol frequently before and asthma was uncontrolled prior to current illness. Qvar every day as maintenance medicine and albuterol if needed for wheezing as rescue inhaler. Follow up with provider here or other medical provider in next 1 month to determine asthma control. If your cough and wheezing are not improving in the next 2 days - return for recheck. If you require albuterol sooner than 4 hours - go to the emergency room. Return to the clinic or go to the nearest emergency room if any of your symptoms worsen or new symptoms occur.  Asthma, Acute Bronchospasm Acute bronchospasm caused by asthma is also referred to as an asthma attack. Bronchospasm means your air passages become narrowed. The narrowing is caused by inflammation and tightening of the muscles in the air tubes (bronchi) in your lungs. This can make it hard to breathe or cause you to wheeze and cough. CAUSES Possible triggers are:  Animal dander from the skin, hair, or feathers of animals.  Dust mites contained in house dust.  Cockroaches.  Pollen from trees or grass.  Mold.  Cigarette or tobacco smoke.  Air pollutants such as dust,  household cleaners, hair sprays, aerosol sprays, paint fumes, strong chemicals, or strong odors.  Cold air or weather changes. Cold air may trigger inflammation. Winds increase molds and pollens in the air.  Strong emotions such as crying or laughing hard.  Stress.  Certain medicines such as aspirin or beta-blockers.  Sulfites in foods and drinks, such as dried fruits and wine.  Infections or inflammatory conditions, such as a flu, cold, or inflammation of the nasal membranes (rhinitis).  Gastroesophageal reflux disease (GERD). GERD is a condition where stomach acid backs up into your esophagus.  Exercise or strenuous activity. SIGNS AND SYMPTOMS   Wheezing.  Excessive coughing, particularly at night.  Chest tightness.  Shortness of breath. DIAGNOSIS  Your health care provider will ask you about your medical history and perform a physical exam. A chest X-ray or blood testing may be performed to look for other causes of your symptoms or other conditions that may have triggered your asthma attack. TREATMENT  Treatment is aimed at reducing inflammation and opening up the airways in your lungs. Most asthma attacks are treated with inhaled medicines. These include quick relief or rescue medicines (such as bronchodilators) and controller medicines (such as inhaled corticosteroids). These medicines are sometimes given through an inhaler or a nebulizer. Systemic steroid medicine taken by mouth or given through an IV tube also can be used to reduce the inflammation when an attack is moderate or severe. Antibiotic medicines are only used if a bacterial infection is present.  HOME CARE INSTRUCTIONS   Rest.  Drink plenty of liquids. This helps the mucus to remain thin and be easily coughed up. Only use caffeine in moderation and do not use alcohol until you have recovered from your illness.  Do not smoke. Avoid being exposed to secondhand smoke.  You play a critical role in keeping  yourself in good health. Avoid exposure to things that cause you to wheeze or to have breathing problems.  Keep your medicines up-to-date and available. Carefully follow your health care provider's treatment plan.  Take your medicine exactly as prescribed.  When pollen or pollution is bad, keep windows closed and use an air conditioner or go to places with air conditioning.  Asthma requires careful medical care. See your health care provider for a follow-up as advised. If you are more than [redacted] weeks pregnant and you were prescribed any new medicines, let your obstetrician know about the visit and how you are doing. Follow up with your health care provider as directed.  After you have recovered from your asthma attack, make an appointment with your outpatient doctor to talk about ways to reduce the likelihood of future attacks. If you do not have a doctor who manages your asthma, make an appointment with a primary care doctor to discuss your asthma. SEEK IMMEDIATE MEDICAL CARE IF:   You are getting worse.  You have trouble breathing. If severe, call your local emergency services (911 in the [place]  You develop chest pain or discomfort.  You are vomiting.  You are not able to keep fluids down.  You are coughing up yellow, green, brown, or bloody sputum.  You have a fever and your symptoms suddenly get worse.  You have trouble swallowing. MAKE SURE YOU:   Understand these instructions.  Will watch your condition.  Will get help right away if you are not doing well or get worse. Document Released: 12/22/2006 Document Revised: 09/11/2013 Document Reviewed: 03/14/2013 [facility] Patient Information 2015 [place], [place]. This information is not intended to replace advice given to you by your health care provider. Make sure you discuss any questions you have with your health care provider.  Gastroenteritis:  Diarrhea Infections caused by germs (bacterial) or a virus commonly cause  diarrhea. Your caregiver has determined that with time, rest and fluids, the diarrhea should improve. In general, eat normally while drinking more water than usual. Although water may prevent dehydration, it does not contain salt and minerals (electrolytes). Broths, weak tea without caffeine and oral rehydration solutions (ORS) replace fluids and electrolytes. Small amounts of fluids should be taken frequently. Large amounts at one time may not be tolerated. Plain water may be harmful in infants and the elderly. Oral rehydrating solutions (ORS) are available at pharmacies and grocery stores. ORS replace water and important electrolytes in proper proportions. Sports drinks are not as effective as ORS and may be harmful due to sugars worsening diarrhea.  ORS is especially recommended for use in children with diarrhea. As a general guideline for children, replace any new fluid losses from diarrhea and/or vomiting with ORS as follows:   If your child weighs 22 pounds or under (10 kg or less), give 60-120 mL ( -  cup or 2 - 4 ounces) of ORS for each episode of diarrheal stool or vomiting episode.   If your child weighs more than 22 pounds (more than 10 kgs), give 120-240 mL ( - 1 cup or 4 - 8 ounces) of ORS for each diarrheal stool or episode of vomiting.   While correcting  for dehydration, children should eat normally. However, foods high in sugar should be avoided because this may worsen diarrhea. Large amounts of carbonated soft drinks, juice, gelatin desserts and other highly sugared drinks should be avoided.   After correction of dehydration, other liquids that are appealing to the child may be added. Children should drink small amounts of fluids frequently and fluids should be increased as tolerated. Children should drink enough fluids to keep urine clear or pale yellow.   Adults should eat normally while drinking more fluids than usual. Drink small amounts of fluids frequently and increase as  tolerated. Drink enough fluids to keep urine clear or pale yellow. Broths, weak decaffeinated tea, lemon lime soft drinks (allowed to go flat) and ORS replace fluids and electrolytes.   Avoid:   Carbonated drinks.   Juice.   Extremely hot or cold fluids.   Caffeine drinks.   Fatty, greasy foods.   Alcohol.   Tobacco.   Too much intake of anything at one time.   Gelatin desserts.   Probiotics are active cultures of beneficial bacteria. They may lessen the amount and number of diarrheal stools in adults. Probiotics can be found in yogurt with active cultures and in supplements.   Wash hands well to avoid spreading bacteria and virus.   Anti-diarrheal medications are not recommended for infants and children.   Only take over-the-counter or prescription medicines for pain, discomfort or fever as directed by your caregiver. Do not give aspirin to children because it may cause Reye's Syndrome.   For adults, ask your caregiver if you should continue all prescribed and over-the-counter medicines.   If your caregiver has given you a follow-up appointment, it is very important to keep that appointment. Not keeping the appointment could result in a chronic or permanent injury, and disability. If there is any problem keeping the appointment, you must call back to this facility for assistance.  SEEK IMMEDIATE MEDICAL CARE IF:   You or your child is unable to keep fluids down or other symptoms or problems become worse in spite of treatment.   Vomiting or diarrhea develops and becomes persistent.   There is vomiting of blood or bile (green material).   There is blood in the stool or the stools are black and tarry.   There is no urine output in 6-8 hours or there is only a small amount of very dark urine.   Abdominal pain develops, increases or localizes.   You have a fever.   Your baby is older than 3 months with a rectal temperature of 102 F (38.9 C) or higher.   Your baby is 743  months old or younger with a rectal temperature of 100.4 F (38 C) or higher.   You or your child develops excessive weakness, dizziness, fainting or extreme thirst.   You or your child develops a rash, stiff neck, severe headache or become irritable or sleepy and difficult to awaken.  MAKE SURE YOU:   Understand these instructions.   Will watch your condition.   Will get help right away if you are not doing well or get worse.  Document Released: 08/27/2002 Document Revised: 08/26/2011 Document Reviewed: 07/14/2009 [facility] Patient Information 2012 [place].  Nausea and Vomiting Nausea is a sick feeling that often comes before throwing up (vomiting). Vomiting is a reflex where stomach contents come out of your mouth. Vomiting can cause severe loss of body fluids (dehydration). Children and elderly adults can become dehydrated quickly, especially if they  also have diarrhea. Nausea and vomiting are symptoms of a condition or disease. It is important to find the cause of your symptoms. CAUSES   Direct irritation of the stomach lining. This irritation can result from increased acid production (gastroesophageal reflux disease), infection, food poisoning, taking certain medicines (such as nonsteroidal anti-inflammatory drugs), alcohol use, or tobacco use.   Signals from the brain.These signals could be caused by a headache, heat exposure, an inner ear disturbance, increased pressure in the brain from injury, infection, a tumor, or a concussion, pain, emotional stimulus, or metabolic problems.   An obstruction in the gastrointestinal tract (bowel obstruction).   Illnesses such as diabetes, hepatitis, gallbladder problems, appendicitis, kidney problems, cancer, sepsis, atypical symptoms of a heart attack, or eating disorders.   Medical treatments such as chemotherapy and radiation.   Receiving medicine that makes you sleep (general anesthetic) during surgery.  DIAGNOSIS Your caregiver  may ask for tests to be done if the problems do not improve after a few days. Tests may also be done if symptoms are severe or if the reason for the nausea and vomiting is not clear. Tests may include:  Urine tests.   Blood tests.   Stool tests.   Cultures (to look for evidence of infection).   X-rays or other imaging studies.  Test results can help your caregiver make decisions about treatment or the need for additional tests. TREATMENT You need to stay well hydrated. Drink frequently but in small amounts.You may wish to drink water, sports drinks, clear broth, or eat frozen ice pops or gelatin dessert to help stay hydrated.When you eat, eating slowly may help prevent nausea.There are also some antinausea medicines that may help prevent nausea. HOME CARE INSTRUCTIONS   Take all medicine as directed by your caregiver.   If you do not have an appetite, do not force yourself to eat. However, you must continue to drink fluids.   If you have an appetite, eat a normal diet unless your caregiver tells you differently.   Eat a variety of complex carbohydrates (rice, wheat, potatoes, bread), lean meats, yogurt, fruits, and vegetables.   Avoid high-fat foods because they are more difficult to digest.   Drink enough water and fluids to keep your urine clear or pale yellow.   If you are dehydrated, ask your caregiver for specific rehydration instructions. Signs of dehydration may include:   Severe thirst.   Dry lips and mouth.   Dizziness.   Dark urine.   Decreasing urine frequency and amount.   Confusion.   Rapid breathing or pulse.  SEEK IMMEDIATE MEDICAL CARE IF:   You have blood or brown flecks (like coffee grounds) in your vomit.   You have black or bloody stools.   You have a severe headache or stiff neck.   You are confused.   You have severe abdominal pain.   You have chest pain or trouble breathing.   You do not urinate at least once every 8 hours.   You  develop cold or clammy skin.   You continue to vomit for longer than 24 to 48 hours.   You have a fever.  MAKE SURE YOU:   Understand these instructions.   Will watch your condition.   Will get help right away if you are not doing well or get worse.  Document Released: 09/06/2005 Document Revised: 08/26/2011 Document Reviewed: 02/03/2011 [facility] Patient Information 2012 [place], [place].  Return to the clinic or go to the nearest emergency room if  any of your symptoms worsen or new symptoms occur.

## 2016-09-10 ENCOUNTER — Emergency Department (HOSPITAL_COMMUNITY)
Admission: EM | Admit: 2016-09-10 | Discharge: 2016-09-10 | Disposition: A | Discharge status: Home / Self Care | Payer: BLUE CROSS/BLUE SHIELD | Attending: Emergency Medicine | Admitting: Emergency Medicine

## 2016-09-10 ENCOUNTER — Encounter (HOSPITAL_COMMUNITY): Payer: Self-pay

## 2016-09-10 ENCOUNTER — Emergency Department (HOSPITAL_COMMUNITY): Payer: BLUE CROSS/BLUE SHIELD

## 2016-09-10 DIAGNOSIS — R112 Nausea with vomiting, unspecified: Secondary | ICD-10-CM | POA: Diagnosis present

## 2016-09-10 DIAGNOSIS — J45909 Unspecified asthma, uncomplicated: Secondary | ICD-10-CM | POA: Diagnosis not present

## 2016-09-10 DIAGNOSIS — Z79899 Other long term (current) drug therapy: Secondary | ICD-10-CM | POA: Diagnosis not present

## 2016-09-10 DIAGNOSIS — R0789 Other chest pain: Secondary | ICD-10-CM

## 2016-09-10 LAB — URINALYSIS, ROUTINE W REFLEX MICROSCOPIC
Bilirubin Urine: NEGATIVE
GLUCOSE, UA: NEGATIVE mg/dL
KETONES UR: NEGATIVE mg/dL
Leukocytes, UA: NEGATIVE
NITRITE: NEGATIVE
PH: 5 (ref 5.0–8.0)
Protein, ur: 30 mg/dL — AB
Specific Gravity, Urine: 1.034 — ABNORMAL HIGH (ref 1.005–1.030)

## 2016-09-10 LAB — COMPREHENSIVE METABOLIC PANEL
ALK PHOS: 45 U/L (ref 38–126)
ALT: 17 U/L (ref 14–54)
AST: 34 U/L (ref 15–41)
Albumin: 4.3 g/dL (ref 3.5–5.0)
Anion gap: 9 (ref 5–15)
BILIRUBIN TOTAL: 0.3 mg/dL (ref 0.3–1.2)
BUN: 14 mg/dL (ref 6–20)
CALCIUM: 9 mg/dL (ref 8.9–10.3)
CO2: 20 mmol/L — ABNORMAL LOW (ref 22–32)
CREATININE: 0.73 mg/dL (ref 0.44–1.00)
Chloride: 108 mmol/L (ref 101–111)
Glucose, Bld: 103 mg/dL — ABNORMAL HIGH (ref 65–99)
Potassium: 3.6 mmol/L (ref 3.5–5.1)
Sodium: 137 mmol/L (ref 135–145)
TOTAL PROTEIN: 7.3 g/dL (ref 6.5–8.1)

## 2016-09-10 LAB — CBC
HCT: 35.4 % — ABNORMAL LOW (ref 36.0–46.0)
Hemoglobin: 12 g/dL (ref 12.0–15.0)
MCH: 30.8 pg (ref 26.0–34.0)
MCHC: 33.9 g/dL (ref 30.0–36.0)
MCV: 90.8 fL (ref 78.0–100.0)
PLATELETS: 343 10*3/uL (ref 150–400)
RBC: 3.9 MIL/uL (ref 3.87–5.11)
RDW: 13.5 % (ref 11.5–15.5)
WBC: 7.3 10*3/uL (ref 4.0–10.5)

## 2016-09-10 LAB — LIPASE, BLOOD: Lipase: 17 U/L (ref 11–51)

## 2016-09-10 LAB — POC URINE PREG, ED: Preg Test, Ur: NEGATIVE

## 2016-09-10 MED ORDER — ONDANSETRON HCL 4 MG/2ML IJ SOLN
4.0000 mg | Freq: Once | INTRAMUSCULAR | Status: AC
  Administered 2016-09-10: 4 mg via INTRAVENOUS
  Filled 2016-09-10: qty 2

## 2016-09-10 MED ORDER — SODIUM CHLORIDE 0.9 % IV BOLUS (SEPSIS)
1000.0000 mL | Freq: Once | INTRAVENOUS | Status: AC
Start: 2016-09-10 — End: 2016-09-10
  Administered 2016-09-10: 1000 mL via INTRAVENOUS

## 2016-09-10 MED ORDER — ONDANSETRON 4 MG PO TBDP: 4.0000 mg | ORAL_TABLET | Freq: Three times a day (TID) | ORAL | 0 refills | Status: AC | PRN

## 2016-09-10 NOTE — ED Provider Notes (Signed)
[facility]EMERGENCY DEPT Provider Note   CSN: [id number] Arrival date & time: 09/10/16  0825     History   Chief Complaint Chief Complaint  Patient presents with  . Emesis    HPI Sonya Scott is a 20 y.o. female.  HPI  Patient presents with concern of nausea, vomiting, weakness, anorexia. Symptoms began 3 days ago without any precipitant. Since onset symptoms of been persistent, no clear alleviating or exacerbating factors. No focal abdominal pain, though the patient has generalized discomfort. Today the patient also developed anterior chest discomfort, which has improved after taking albuterol. No fever, no confusion, no disorientation, no syncope. Last menstrual cycle was one month ago, and the patient recently started this cycle. Patient denies history of medical issues beyond asthma, does not smoke. No sick contacts.   Past Medical History:  Diagnosis Date  . Asthma   . Near syncope     There are no active problems to display for this patient.   Past Surgical History:  Procedure Laterality Date  . WISDOM TOOTH EXTRACTION  2013    OB History    No data available       Home Medications    Prior to Admission medications   Medication Sig Start Date End Date Taking? Authorizing Provider  acetaminophen (TYLENOL) 500 MG tablet Take 1,000 mg by mouth every 6 (six) hours as needed (cramps).   Yes Historical Provider, MD  albuterol (PROVENTIL HFA;VENTOLIN HFA) 108 (90 BASE) MCG/ACT inhaler Inhale 1-2 puffs into the lungs every 4 (four) hours as needed for wheezing or shortness of breath. 10/23/14  Yes Shade FloodJeffrey R Greene, MD  beclomethasone (QVAR) 80 MCG/ACT inhaler Inhale 1 puff into the lungs 2 (two) times daily. Patient not taking: Reported on 09/10/2016 10/23/14   Shade FloodJeffrey R Greene, MD  meclizine (ANTIVERT) 25 MG tablet Take 1 tablet (25 mg total) by mouth 3 (three) times daily as needed for dizziness. Patient not taking: Reported on 09/10/2016 02/25/14   Mellody DrownLauren Parker,  PA-C  predniSONE (DELTASONE) 20 MG tablet Take 2 tablets (40 mg total) by mouth daily with breakfast. Patient not taking: Reported on 09/10/2016 10/23/14   Shade FloodJeffrey R Greene, MD    Family History Family History  Problem Relation Age of Onset  . Asthma Brother     Social History Social History  Substance Use Topics  . Smoking status: Never Smoker  . Smokeless tobacco: Never Used  . Alcohol use No     Comment: Occassion      Allergies   Patient has no known allergies.   Review of Systems Review of Systems  Constitutional:       Per HPI, otherwise negative  HENT:       Per HPI, otherwise negative  Respiratory:       Per HPI, otherwise negative  Cardiovascular:       Per HPI, otherwise negative  Gastrointestinal: Positive for nausea and vomiting.  Endocrine:       Negative aside from HPI  Genitourinary:       Neg aside from HPI   Musculoskeletal:       Per HPI, otherwise negative  Skin: Negative.   Neurological: Negative for syncope.     Physical Exam Updated Vital Signs BP 111/72 (BP Location: Right Arm)   Pulse 75   Resp 14   Ht 5\' 1"  (1.549 m)   Wt 123 lb (55.8 kg)   LMP 09/06/2016   SpO2 98%   BMI 23.24 kg/m   Physical  Exam  Constitutional: She is oriented to person, place, and time. She appears well-developed and well-nourished. No distress.  HENT:  Head: Normocephalic and atraumatic.  Eyes: Conjunctivae and EOM are normal.  Cardiovascular: Normal rate and regular rhythm.   Pulmonary/Chest: Effort normal and breath sounds normal. No stridor. No respiratory distress.  Abdominal: She exhibits no distension. There is no tenderness.  Musculoskeletal: She exhibits no edema.  Neurological: She is alert and oriented to person, place, and time. No cranial nerve deficit.  Skin: Skin is warm and dry.  Psychiatric: She has a normal mood and affect.  Nursing note and vitals reviewed.    ED Treatments / Results  Labs (all labs ordered are listed, but only  abnormal results are displayed) Labs Reviewed  COMPREHENSIVE METABOLIC PANEL - Abnormal; Notable for the following:       Result Value   CO2 20 (*)    Glucose, Bld 103 (*)    All other components within normal limits  CBC - Abnormal; Notable for the following:    HCT 35.4 (*)    All other components within normal limits  URINALYSIS, ROUTINE W REFLEX MICROSCOPIC - Abnormal; Notable for the following:    APPearance HAZY (*)    Specific Gravity, Urine 1.034 (*)    Hgb urine dipstick MODERATE (*)    Protein, ur 30 (*)    Bacteria, UA RARE (*)    Squamous Epithelial / LPF 0-5 (*)    All other components within normal limits  LIPASE, BLOOD  POC URINE PREG, ED    Radiology Dg Chest 2 View  Result Date: 09/10/2016 CLINICAL DATA:  Chest pain EXAM: CHEST  2 VIEW COMPARISON:  07/05/2006 FINDINGS: Normal heart size and mediastinal contours. No acute infiltrate or edema. No effusion or pneumothorax. No osseous findings. IMPRESSION: Negative chest. Electronically Signed   By: Marnee SpringJonathon  Watts M.D.   On: 09/10/2016 10:32    Procedures Procedures (including critical care time)  Medications Ordered in ED Medications  sodium chloride 0.9 % bolus 1,000 mL (1,000 mLs Intravenous New Bag/Given 09/10/16 1008)  ondansetron (ZOFRAN) injection 4 mg (4 mg Intravenous Given 09/10/16 1007)     Initial Impression / Assessment and Plan / ED Course  I have reviewed the triage vital signs and the nursing notes.  Pertinent labs & imaging results that were available during my care of the patient were reviewed by me and considered in my medical decision making (see chart for details).  Clinical Course   On repeat exam the patient feels better, no additional episodes of vomiting. We discussed all findings at length including return precautions, home instructions.  This young female presents with several days of generalized discomfort, chest pain, nausea, vomiting. Here she is awake and alert, with soft,  non-peritoneal abdomen, and there is low suspicion for acute abdominal processes. Chest pain likely secondary to asthma, the patient has no evidence for ongoing coronary ischemia, and minimal risk profile. With improvement following fluid resuscitation, antiemetics, the patient was discharged in stable condition.  Final Clinical Impressions(s) / ED Diagnoses  Nausea and vomiting Chest pain    Gerhard Munchobert Maxima Skelton, MD 09/10/16 1158

## 2016-09-10 NOTE — Discharge Instructions (Signed)
As discussed, your evaluation today has been largely reassuring.  But, it is important that you monitor your condition carefully, and do not hesitate to return to the ED if you develop new, or concerning changes in your condition. ? ?Otherwise, please follow-up with your physician for appropriate ongoing care. ? ?

## 2016-09-10 NOTE — ED Triage Notes (Signed)
Per Pt, Pt is coming from home with complaints of nausea, vomiting, and diarrhea x 2 days. Pt reports waking up this morning with SOB and chest pain that last for about an hour and then subsided. Pt denies any family members with similar symptoms.

## 2016-09-19 ENCOUNTER — Encounter (HOSPITAL_COMMUNITY): Payer: Self-pay | Admitting: *Deleted

## 2016-09-19 ENCOUNTER — Emergency Department (HOSPITAL_COMMUNITY): Payer: Self-pay

## 2016-09-19 ENCOUNTER — Emergency Department (HOSPITAL_COMMUNITY)
Admission: EM | Admit: 2016-09-19 | Discharge: 2016-09-19 | Disposition: A | Discharge status: Home / Self Care | Payer: Self-pay | Attending: Emergency Medicine | Admitting: Emergency Medicine

## 2016-09-19 DIAGNOSIS — Z79899 Other long term (current) drug therapy: Secondary | ICD-10-CM | POA: Insufficient documentation

## 2016-09-19 DIAGNOSIS — K529 Noninfective gastroenteritis and colitis, unspecified: Secondary | ICD-10-CM | POA: Insufficient documentation

## 2016-09-19 DIAGNOSIS — J45909 Unspecified asthma, uncomplicated: Secondary | ICD-10-CM | POA: Insufficient documentation

## 2016-09-19 DIAGNOSIS — Z7982 Long term (current) use of aspirin: Secondary | ICD-10-CM | POA: Insufficient documentation

## 2016-09-19 LAB — COMPREHENSIVE METABOLIC PANEL
ALT: 17 U/L (ref 14–54)
AST: 21 U/L (ref 15–41)
Albumin: 5.1 g/dL — ABNORMAL HIGH (ref 3.5–5.0)
Alkaline Phosphatase: 46 U/L (ref 38–126)
Anion gap: 9 (ref 5–15)
BUN: 15 mg/dL (ref 6–20)
CO2: 23 mmol/L (ref 22–32)
Calcium: 9.2 mg/dL (ref 8.9–10.3)
Chloride: 105 mmol/L (ref 101–111)
Creatinine, Ser: 0.72 mg/dL (ref 0.44–1.00)
GFR calc Af Amer: >60 mL/min (ref >60)
GFR calc non Af Amer: >60 mL/min (ref >60)
Glucose, Bld: 94 mg/dL (ref 65–99)
Potassium: 3.8 mmol/L (ref 3.5–5.1)
Sodium: 137 mmol/L (ref 135–145)
Total Bilirubin: 0.5 mg/dL (ref 0.3–1.2)
Total Protein: 8.1 g/dL (ref 6.5–8.1)

## 2016-09-19 LAB — URINALYSIS, ROUTINE W REFLEX MICROSCOPIC
Bacteria, UA: NONE SEEN
Bilirubin Urine: NEGATIVE
Glucose, UA: NEGATIVE mg/dL
Ketones, ur: NEGATIVE mg/dL
Leukocytes, UA: NEGATIVE
Nitrite: NEGATIVE
Protein, ur: 30 mg/dL — AB
Specific Gravity, Urine: 1.03 (ref 1.005–1.030)
pH: 5 (ref 5.0–8.0)

## 2016-09-19 LAB — CBC
HCT: 37.9 % (ref 36.0–46.0)
Hemoglobin: 13 g/dL (ref 12.0–15.0)
MCH: 31.3 pg (ref 26.0–34.0)
MCHC: 34.3 g/dL (ref 30.0–36.0)
MCV: 91.3 fL (ref 78.0–100.0)
Platelets: 388 10*3/uL (ref 150–400)
RBC: 4.15 MIL/uL (ref 3.87–5.11)
RDW: 13.7 % (ref 11.5–15.5)
WBC: 8.9 10*3/uL (ref 4.0–10.5)

## 2016-09-19 LAB — LIPASE, BLOOD: Lipase: 19 U/L (ref 11–51)

## 2016-09-19 LAB — POC URINE PREG, ED: Preg Test, Ur: NEGATIVE

## 2016-09-19 MED ORDER — SODIUM CHLORIDE 0.9 % IV BOLUS (SEPSIS)
1000.0000 mL | Freq: Once | INTRAVENOUS | Status: AC
  Administered 2016-09-19: 1000 mL via INTRAVENOUS

## 2016-09-19 MED ORDER — PROMETHAZINE HCL 25 MG PO TABS: 25.0000 mg | ORAL_TABLET | Freq: Three times a day (TID) | ORAL | 0 refills | Status: AC | PRN

## 2016-09-19 MED ORDER — ONDANSETRON HCL 4 MG/2ML IJ SOLN
4.0000 mg | Freq: Once | INTRAMUSCULAR | Status: AC
  Administered 2016-09-19: 4 mg via INTRAVENOUS
  Filled 2016-09-19: qty 2

## 2016-09-19 NOTE — ED Notes (Signed)
Pt offered PO fluids 

## 2016-09-19 NOTE — Discharge Instructions (Signed)
Return here as needed. Follow up with a primary doctor. I would stick to a bland diet over the next 48 hours. Toast, rice, bananas, apple sauce, and jello. Slowly increase your fluid intake.

## 2016-09-19 NOTE — ED Notes (Signed)
Pt ambulatory without assistance to restroom 

## 2016-09-19 NOTE — ED Notes (Signed)
This RN attempted to collect blood work x 1 unsuccessfully

## 2016-09-19 NOTE — ED Notes (Signed)
We have had great difficulty starting IV/drawing blood. Our C.N. Will attempt u/s-guided IV shortly.

## 2016-09-19 NOTE — ED Triage Notes (Signed)
Pt complains of n/v/d, RUQ abd pain radiating to back and cough for the past 5 days. Pt went to [facility] 3 days ago, prescribed zofran. Pt states zofran is not helping. Pt has hx of asthma, states the vomiting makes it difficult to breath and has had to use inhaler.Pt states she has been unable to keep fluids down.

## 2016-09-19 NOTE — ED Provider Notes (Signed)
[facility]-EMERGENCY DEPT Provider Note   CSN: [id number] Arrival date & time: 09/19/16  1214     History   Chief Complaint Chief Complaint  Patient presents with  . Abdominal Pain  . Emesis  . Diarrhea    HPI Sonya Scott is a 20 y.o. female    Patient is a 20 year old with history of asthma recently seen at [facility] ED on the 22nd presents with cough, abdominal pain, vomiting, diarrhea for a week. Patient states that since she was last seen she has not improved. She states that she has shortness of breath secondary to her cough. She says that her cough is mildly productive with white in slight yellow foam. She states that she saw one episode of small amount of redness. Patient states that her symptoms have been persistent. She reports associated weakness and anorexia. Patient states that she has pain on her sides at lower rib cages and upper abdomen when she coughs and does not feel the pain at rest. She states that it feels more muscular and may be due to her cough. She denies any alleviating factors. Patient reports using pro-air for her asthma, however has recently finished her inhaler. She states that she is not able to tolerate much  food or water  because of her cough causing her to vomit and take it back out. Patient reports to me that she thinks her symptoms are related to her cough. Patient reports not having a primary care physician at this time but will be seeing one soon. Patient denies chest pain, fevers, chills, congestion, recent sick contacts. Patient's last menstrual cycle was on the 25th.     The history is provided by the patient.    Past Medical History:  Diagnosis Date  . Asthma   . Near syncope     There are no active problems to display for this patient.   Past Surgical History:  Procedure Laterality Date  . WISDOM TOOTH EXTRACTION  2013    OB History    No data available       Home Medications    Prior to Admission medications   Medication  Sig Start Date End Date Taking? Authorizing Provider  albuterol (PROVENTIL HFA;VENTOLIN HFA) 108 (90 BASE) MCG/ACT inhaler Inhale 1-2 puffs into the lungs every 4 (four) hours as needed for wheezing or shortness of breath. 10/23/14  Yes Shade Flood, MD  aspirin EC 81 MG tablet Take 81 mg by mouth as needed (pain).    Yes Historical Provider, MD  ibuprofen (ADVIL,MOTRIN) 200 MG tablet Take 400 mg by mouth every 6 (six) hours as needed.   Yes Historical Provider, MD  ondansetron (ZOFRAN ODT) 4 MG disintegrating tablet Take 1 tablet (4 mg total) by mouth every 8 (eight) hours as needed for nausea or vomiting. 09/10/16  Yes Gerhard Munch, MD  beclomethasone (QVAR) 80 MCG/ACT inhaler Inhale 1 puff into the lungs 2 (two) times daily. Patient not taking: Reported on 09/19/2016 10/23/14   Shade Flood, MD    Family History Family History  Problem Relation Age of Onset  . Asthma Brother     Social History Social History  Substance Use Topics  . Smoking status: Never Smoker  . Smokeless tobacco: Never Used  . Alcohol use No     Comment: Occassion      Allergies   Patient has no known allergies.   Review of Systems Review of Systems  Constitutional: Negative for chills and fever.  HENT: Negative for congestion, ear pain, hearing loss and trouble swallowing.   Eyes: Negative for pain and visual disturbance.  Respiratory: Positive for cough, shortness of breath and wheezing.   Cardiovascular: Negative for chest pain and palpitations.  Gastrointestinal: Positive for diarrhea, nausea and vomiting. Negative for abdominal pain.  Genitourinary: Negative for dysuria and hematuria.  Musculoskeletal: Negative for arthralgias and back pain.  Skin: Negative for color change, rash and wound.  Neurological: Positive for weakness. Negative for syncope.     Physical Exam Updated Vital Signs BP 128/77 (BP Location: Right Arm)   Pulse 90   Temp 98.6 F (37 C) (Oral)   Resp 18   Ht 5\' 2"   (1.575 m)   Wt 56.7 kg   LMP 09/13/2016   SpO2 100%   BMI 22.86 kg/m   Physical Exam  Constitutional: She is oriented to person, place, and time. She appears well-developed and well-nourished.  HENT:  Head: Normocephalic and atraumatic.  Nose: Nose normal.  Mouth/Throat: Oropharynx is clear and moist.  Eyes: Conjunctivae and EOM are normal. Pupils are equal, round, and reactive to light.  Neck: Normal range of motion. Neck supple.  Cardiovascular: Normal rate and normal heart sounds.   Pulmonary/Chest: Effort normal. No respiratory distress. She has wheezes. She exhibits no tenderness.  Abdominal: Soft. Bowel sounds are normal. There is tenderness. There is no rebound.  Musculoskeletal: Normal range of motion.  Lymphadenopathy:       Head (right side): No submental, no submandibular, no tonsillar, no preauricular, no posterior auricular and no occipital adenopathy present.       Head (left side): No submental, no submandibular, no tonsillar, no preauricular, no posterior auricular and no occipital adenopathy present.    She has no cervical adenopathy.  Neurological: She is alert and oriented to person, place, and time.  Skin: Skin is warm. Capillary refill takes less than 2 seconds. No rash noted. No erythema.  Psychiatric: She has a normal mood and affect. Her behavior is normal.  Nursing note and vitals reviewed.    ED Treatments / Results  Labs (all labs ordered are listed, but only abnormal results are displayed) Labs Reviewed  URINALYSIS, ROUTINE W REFLEX MICROSCOPIC - Abnormal; Notable for the following:       Result Value   APPearance HAZY (*)    Hgb urine dipstick SMALL (*)    Protein, ur 30 (*)    Squamous Epithelial / LPF 6-30 (*)    All other components within normal limits  LIPASE, BLOOD  COMPREHENSIVE METABOLIC PANEL  CBC  POC URINE PREG, ED    EKG  EKG Interpretation None       Radiology Dg Chest 2 View  Result Date: 09/19/2016 CLINICAL DATA:   Patient with dry cough for 1 week. EXAM: CHEST  2 VIEW COMPARISON:  Chest radiograph 09/10/2016 FINDINGS: The heart size and mediastinal contours are within normal limits. Both lungs are clear. The visualized skeletal structures are unremarkable. IMPRESSION: No active cardiopulmonary disease. Electronically Signed   By: Annia Beltrew  Davis M.D.   On: 09/19/2016 15:48    Procedures Procedures (including critical care time)  Medications Ordered in ED Medications - No data to display   Initial Impression / Assessment and Plan / ED Course  I have reviewed the triage vital signs and the nursing notes.  Pertinent labs & imaging results that were available during my care of the patient were reviewed by me and considered in my medical decision making (see chart  for details).  Clinical Course   Patient is a 20 year old female presenting with nausea, vomiting, diarrhea, and upper abdominal pain for a week. Patient is seen on the 22nd at [facility]. On exam patient is afebrile, vital signs stable, in no apparent distress. Abdomen is soft, upper abdomen is tender with slight guarding. Mildly positive Murphy sign. No focal tenderness at McBurney's point.  Chest xray negative for any acute findings, signs of pneumonia, or effusion. Urinalysis does not show obvious signs of infection.   Pending lab work.   At shift change [facility] who will follow pending studies, re-evaulate and determine disposition.    Final Clinical Impressions(s) / ED Diagnoses   Final diagnoses:  None    New Prescriptions New Prescriptions   No medications on file     [address]Francisco Manuel [place], [place] 09/19/16 [address]1614    Francisco Manuel [place] 09/19/16 1615    Raeford RazorStephen Kohut, MD 09/23/16 1330

## 2016-09-19 NOTE — ED Notes (Signed)
Was unsuccessful with lab draw, RN made aware.  

## 2018-08-21 IMAGING — DX DG CHEST 2V
2 series · 2 of 2 positions shown · non-contrast
Comparison: 07/05/2006

CLINICAL DATA: Chest pain

EXAM:
CHEST  2 VIEW

[chest pa]
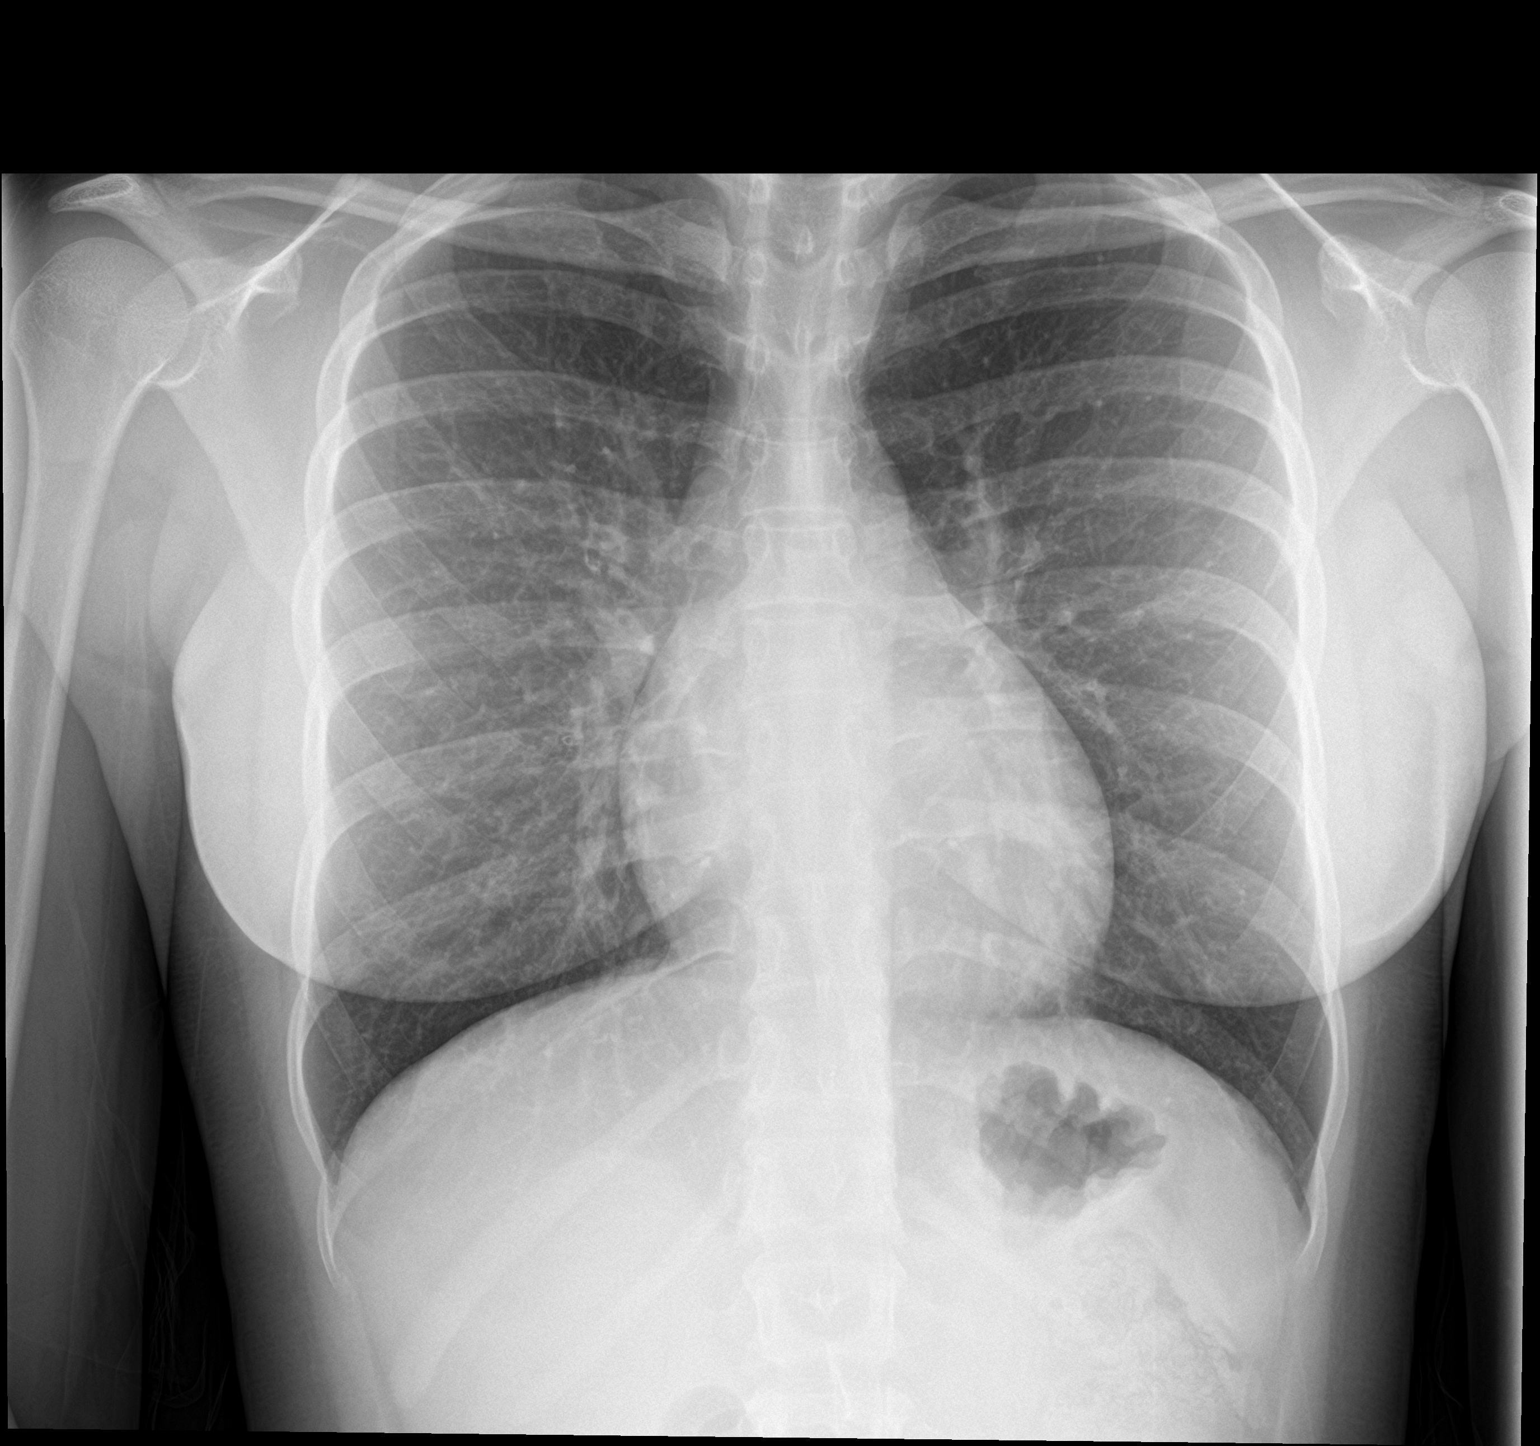

[chest lat]
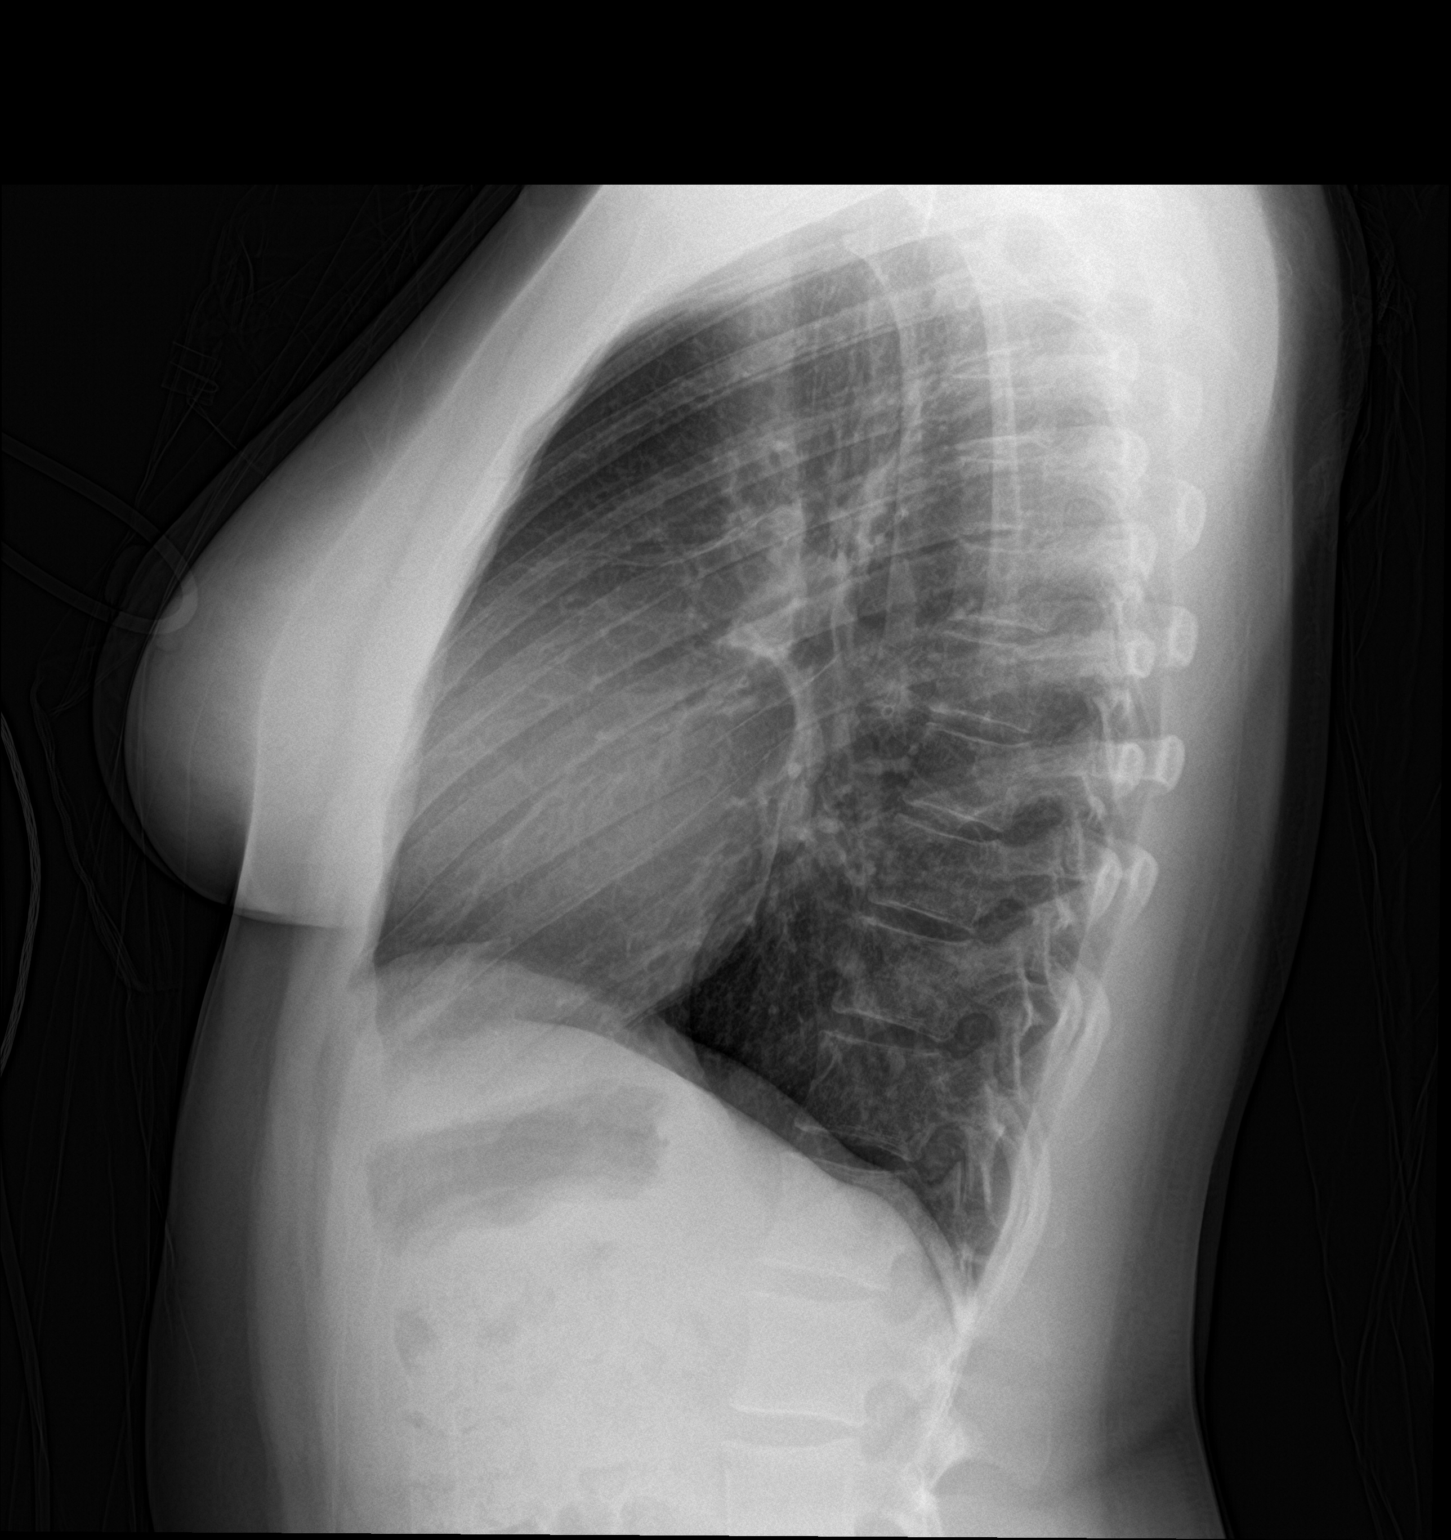

[2 of 2 positions shown; findings below may reference images not displayed]

FINDINGS: Normal heart size and mediastinal contours. No acute infiltrate or
edema. No effusion or pneumothorax. No osseous findings.
IMPRESSION: Negative chest.
